# Patient Record
Sex: Female | Born: 1965 | Race: White | Hispanic: No | Marital: Married | Smoking: Former smoker
Health system: Southern US, Community
[De-identification: ages and names within clinical notes are randomized; demographics above are authoritative.]

## PROBLEM LIST (undated history)

## (undated) DIAGNOSIS — F419 Anxiety disorder, unspecified: Secondary | ICD-10-CM

## (undated) DIAGNOSIS — I480 Paroxysmal atrial fibrillation: Secondary | ICD-10-CM

## (undated) HISTORY — DX: Anxiety disorder, unspecified: F41.9

## (undated) HISTORY — DX: Paroxysmal atrial fibrillation: I48.0

## (undated) HISTORY — PX: HAND SURGERY: SHX662

## (undated) HISTORY — PX: TUBAL LIGATION: SHX77

## (undated) HISTORY — PX: BREAST EXCISIONAL BIOPSY: SUR124

---

## 1997-08-24 ENCOUNTER — Emergency Department (HOSPITAL_COMMUNITY): Admission: EM | Admit: 1997-08-24 | Discharge: 1997-08-24 | Payer: Self-pay | Admitting: Emergency Medicine

## 1998-06-28 ENCOUNTER — Emergency Department (HOSPITAL_COMMUNITY): Admission: EM | Admit: 1998-06-28 | Discharge: 1998-06-28 | Payer: Self-pay | Admitting: Emergency Medicine

## 2000-10-31 ENCOUNTER — Encounter: Admission: RE | Admit: 2000-10-31 | Discharge: 2000-10-31 | Payer: Self-pay | Admitting: Family Medicine

## 2000-10-31 ENCOUNTER — Other Ambulatory Visit: Admission: RE | Admit: 2000-10-31 | Discharge: 2000-10-31 | Payer: Self-pay | Admitting: Family Medicine

## 2001-07-21 ENCOUNTER — Encounter (INDEPENDENT_AMBULATORY_CARE_PROVIDER_SITE_OTHER): Payer: Self-pay | Admitting: *Deleted

## 2001-08-01 ENCOUNTER — Encounter: Admission: RE | Admit: 2001-08-01 | Discharge: 2001-08-01 | Payer: Self-pay | Admitting: Family Medicine

## 2003-12-31 ENCOUNTER — Inpatient Hospital Stay (HOSPITAL_COMMUNITY): Admission: AD | Admit: 2003-12-31 | Discharge: 2003-12-31 | Payer: Self-pay | Admitting: Obstetrics and Gynecology

## 2004-03-01 ENCOUNTER — Emergency Department (HOSPITAL_COMMUNITY): Admission: EM | Admit: 2004-03-01 | Discharge: 2004-03-01 | Payer: Self-pay | Admitting: Family Medicine

## 2006-05-23 HISTORY — PX: BREAST BIOPSY: SHX20

## 2006-07-21 ENCOUNTER — Encounter (INDEPENDENT_AMBULATORY_CARE_PROVIDER_SITE_OTHER): Payer: Self-pay | Admitting: *Deleted

## 2010-09-15 ENCOUNTER — Emergency Department: Payer: Self-pay | Admitting: Emergency Medicine

## 2010-09-22 ENCOUNTER — Emergency Department: Payer: Self-pay | Admitting: Emergency Medicine

## 2010-09-29 ENCOUNTER — Inpatient Hospital Stay (INDEPENDENT_AMBULATORY_CARE_PROVIDER_SITE_OTHER)
Admission: RE | Admit: 2010-09-29 | Discharge: 2010-09-29 | Disposition: A | Discharge status: Home / Self Care | Payer: Self-pay | Source: Ambulatory Visit | Attending: Family Medicine | Admitting: Family Medicine

## 2010-09-29 ENCOUNTER — Ambulatory Visit (INDEPENDENT_AMBULATORY_CARE_PROVIDER_SITE_OTHER): Payer: Self-pay

## 2010-09-29 DIAGNOSIS — R5381 Other malaise: Secondary | ICD-10-CM

## 2010-09-29 LAB — POCT I-STAT, CHEM 8
Creatinine, Ser: 0.8 mg/dL (ref 0.4–1.2)
HCT: 42 % (ref 36.0–46.0)
TCO2: 29 mmol/L (ref 0–100)

## 2010-09-29 LAB — TSH: TSH: 3.202 u[IU]/mL (ref 0.350–4.500)

## 2011-12-13 ENCOUNTER — Ambulatory Visit: Payer: Self-pay

## 2011-12-16 ENCOUNTER — Ambulatory Visit: Payer: Self-pay

## 2013-01-02 ENCOUNTER — Ambulatory Visit: Payer: Self-pay

## 2013-02-26 ENCOUNTER — Emergency Department: Payer: Self-pay | Admitting: Emergency Medicine

## 2013-02-26 LAB — TSH: Thyroid Stimulating Horm: 3.98 u[IU]/mL

## 2013-02-26 LAB — URINALYSIS, COMPLETE
Blood: NEGATIVE
Glucose,UR: NEGATIVE mg/dL (ref 0–75)
Ketone: NEGATIVE
Leukocyte Esterase: NEGATIVE
Protein: NEGATIVE
Specific Gravity: 1.012 (ref 1.003–1.030)
Squamous Epithelial: <1

## 2013-02-26 LAB — COMPREHENSIVE METABOLIC PANEL
Albumin: 4.2 g/dL (ref 3.4–5.0)
Alkaline Phosphatase: 47 U/L — ABNORMAL LOW (ref 50–136)
Bilirubin,Total: 0.5 mg/dL (ref 0.2–1.0)
Calcium, Total: 9.1 mg/dL (ref 8.5–10.1)
Chloride: 104 mmol/L (ref 98–107)
EGFR (Non-African Amer.): >60
Glucose: 81 mg/dL (ref 65–99)
Potassium: 3.9 mmol/L (ref 3.5–5.1)
SGPT (ALT): 22 U/L (ref 12–78)
Total Protein: 7.7 g/dL (ref 6.4–8.2)

## 2013-02-26 LAB — CBC WITH DIFFERENTIAL/PLATELET
Basophil %: 0.8 %
HCT: 38.8 % (ref 35.0–47.0)
HGB: 13.4 g/dL (ref 12.0–16.0)
Lymphocyte #: 1.5 10*3/uL (ref 1.0–3.6)
Lymphocyte %: 30.4 %
MCHC: 34.4 g/dL (ref 32.0–36.0)
MCV: 87 fL (ref 80–100)
Monocyte #: 0.3 x10 3/mm (ref 0.2–0.9)
Neutrophil #: 3 10*3/uL (ref 1.4–6.5)
RBC: 4.44 10*6/uL (ref 3.80–5.20)
RDW: 14.5 % (ref 11.5–14.5)

## 2013-05-08 ENCOUNTER — Emergency Department: Payer: Self-pay | Admitting: Emergency Medicine

## 2013-05-08 LAB — COMPREHENSIVE METABOLIC PANEL
BUN: 15 mg/dL (ref 7–18)
Creatinine: 0.7 mg/dL (ref 0.60–1.30)
Glucose: 106 mg/dL — ABNORMAL HIGH (ref 65–99)
Osmolality: 281 (ref 275–301)
SGOT(AST): 24 U/L (ref 15–37)
SGPT (ALT): 24 U/L (ref 12–78)
Sodium: 140 mmol/L (ref 136–145)
Total Protein: 7.5 g/dL (ref 6.4–8.2)

## 2013-05-08 LAB — CBC
MCH: 29.4 pg (ref 26.0–34.0)
MCHC: 33.4 g/dL (ref 32.0–36.0)
MCV: 88 fL (ref 80–100)
Platelet: 215 10*3/uL (ref 150–440)
RDW: 14.4 % (ref 11.5–14.5)

## 2013-05-08 LAB — URINALYSIS, COMPLETE
Bilirubin,UR: NEGATIVE
Glucose,UR: NEGATIVE mg/dL (ref 0–75)
Ketone: NEGATIVE
Nitrite: NEGATIVE
Specific Gravity: 1.014 (ref 1.003–1.030)

## 2014-06-09 ENCOUNTER — Ambulatory Visit: Payer: Self-pay | Admitting: Internal Medicine

## 2015-08-12 ENCOUNTER — Encounter: Payer: Self-pay | Admitting: Medical Oncology

## 2015-08-12 ENCOUNTER — Emergency Department: Payer: 59

## 2015-08-12 ENCOUNTER — Emergency Department
Admission: EM | Admit: 2015-08-12 | Discharge: 2015-08-12 | Disposition: A | Discharge status: Home / Self Care | Payer: 59 | Attending: Emergency Medicine | Admitting: Emergency Medicine

## 2015-08-12 DIAGNOSIS — F17200 Nicotine dependence, unspecified, uncomplicated: Secondary | ICD-10-CM | POA: Diagnosis not present

## 2015-08-12 DIAGNOSIS — R1011 Right upper quadrant pain: Secondary | ICD-10-CM

## 2015-08-12 DIAGNOSIS — Z9851 Tubal ligation status: Secondary | ICD-10-CM | POA: Insufficient documentation

## 2015-08-12 DIAGNOSIS — K2970 Gastritis, unspecified, without bleeding: Secondary | ICD-10-CM | POA: Diagnosis not present

## 2015-08-12 DIAGNOSIS — R1010 Upper abdominal pain, unspecified: Secondary | ICD-10-CM

## 2015-08-12 DIAGNOSIS — K859 Acute pancreatitis without necrosis or infection, unspecified: Secondary | ICD-10-CM | POA: Insufficient documentation

## 2015-08-12 LAB — COMPREHENSIVE METABOLIC PANEL
ALBUMIN: 3.9 g/dL (ref 3.5–5.0)
ALK PHOS: 43 U/L (ref 38–126)
ALT: 22 U/L (ref 14–54)
ANION GAP: 7 (ref 5–15)
AST: 28 U/L (ref 15–41)
BUN: 15 mg/dL (ref 6–20)
CALCIUM: 8.6 mg/dL — AB (ref 8.9–10.3)
CHLORIDE: 101 mmol/L (ref 101–111)
CO2: 27 mmol/L (ref 22–32)
Creatinine, Ser: 0.79 mg/dL (ref 0.44–1.00)
GFR calc Af Amer: >60 mL/min (ref >60)
GFR calc non Af Amer: >60 mL/min (ref >60)
GLUCOSE: 101 mg/dL — AB (ref 65–99)
Potassium: 3.9 mmol/L (ref 3.5–5.1)
SODIUM: 135 mmol/L (ref 135–145)
Total Bilirubin: 0.6 mg/dL (ref 0.3–1.2)
Total Protein: 7 g/dL (ref 6.5–8.1)

## 2015-08-12 LAB — URINALYSIS COMPLETE WITH MICROSCOPIC (ARMC ONLY)
BACTERIA UA: NONE SEEN
Bilirubin Urine: NEGATIVE
Glucose, UA: NEGATIVE mg/dL
Leukocytes, UA: NEGATIVE
Nitrite: NEGATIVE
PH: 6 (ref 5.0–8.0)
PROTEIN: NEGATIVE mg/dL
Specific Gravity, Urine: 1.014 (ref 1.005–1.030)

## 2015-08-12 LAB — LIPASE, BLOOD: Lipase: 55 U/L — ABNORMAL HIGH (ref 11–51)

## 2015-08-12 LAB — CBC
HCT: 44 % (ref 35.0–47.0)
HEMOGLOBIN: 15.1 g/dL (ref 12.0–16.0)
MCH: 30.5 pg (ref 26.0–34.0)
MCHC: 34.2 g/dL (ref 32.0–36.0)
MCV: 88.9 fL (ref 80.0–100.0)
Platelets: 189 10*3/uL (ref 150–440)
RBC: 4.95 MIL/uL (ref 3.80–5.20)
RDW: 14 % (ref 11.5–14.5)
WBC: 4.8 10*3/uL (ref 3.6–11.0)

## 2015-08-12 LAB — POCT PREGNANCY, URINE: PREG TEST UR: NEGATIVE

## 2015-08-12 NOTE — ED Notes (Signed)
Pt reports she has been having upper abd pain for a while but the past few days pain has worsened and she has became nauseated. Pt reports she went to [facility] last night and was given a GI cocktail which seemed to have helped for a little while then pain began again.

## 2015-08-12 NOTE — ED Notes (Signed)
Patient transported to Ultrasound 

## 2015-08-12 NOTE — Discharge Instructions (Signed)
Abdominal Pain, Adult °Many things can cause abdominal pain. Usually, abdominal pain is not caused by a disease and will improve without treatment. It can often be observed and treated at home. Your health care provider will do a physical exam and possibly order blood tests and X-rays to help determine the seriousness of your pain. However, in many cases, more time must pass before a clear cause of the pain can be found. Before that point, your health care provider may not know if you need more testing or further treatment. °HOME CARE INSTRUCTIONS °Monitor your abdominal pain for any changes. The following actions may help to alleviate any discomfort you are experiencing: °· Only take over-the-counter or prescription medicines as directed by your health care provider. °· Do not take laxatives unless directed to do so by your health care provider. °· Try a clear liquid diet (broth, tea, or water) as directed by your health care provider. Slowly move to a bland diet as tolerated. °SEEK MEDICAL CARE IF: °· You have unexplained abdominal pain. °· You have abdominal pain associated with nausea or diarrhea. °· You have pain when you urinate or have a bowel movement. °· You experience abdominal pain that wakes you in the night. °· You have abdominal pain that is worsened or improved by eating food. °· You have abdominal pain that is worsened with eating fatty foods. °· You have a fever. °SEEK IMMEDIATE MEDICAL CARE IF: °· Your pain does not go away within 2 hours. °· You keep throwing up (vomiting). °· Your pain is felt only in portions of the abdomen, such as the right side or the left lower portion of the abdomen. °· You pass bloody or black tarry stools. °MAKE SURE YOU: °· Understand these instructions. °· Will watch your condition. °· Will get help right away if you are not doing well or get worse. °  °This information is not intended to replace advice given to you by your health care provider. Make sure you discuss  any questions you have with your health care provider. °  °Document Released: 02/16/2005 Document Revised: 01/28/2015 Document Reviewed: 01/16/2013 °Elsevier Interactive Patient Education ©2016 [employer] ° °Acute Pancreatitis °Acute pancreatitis is a disease in which the pancreas becomes suddenly inflamed. The pancreas is a large gland located behind your stomach. The pancreas produces enzymes that help digest food. The pancreas also releases the hormones glucagon and insulin that help regulate blood sugar. Damage to the pancreas occurs when the digestive enzymes from the pancreas are activated and begin attacking the pancreas before being released into the intestine. Most acute attacks last a couple of days and can cause serious complications. Some people become dehydrated and develop low blood pressure. In severe cases, bleeding into the pancreas can lead to shock and can be life-threatening. The lungs, heart, and kidneys may fail. °CAUSES  °Pancreatitis can happen to anyone. In some cases, the cause is unknown. Most cases are caused by: °· Alcohol abuse. °· Gallstones. °Other less common causes are: °· Certain medicines. °· Exposure to certain chemicals. °· Infection. °· Damage caused by an accident (trauma). °· Abdominal surgery. °SYMPTOMS  °· Pain in the upper abdomen that may radiate to the back. °· Tenderness and swelling of the abdomen. °· Nausea and vomiting. °DIAGNOSIS  °Your caregiver will perform a physical exam. Blood and stool tests may be done to confirm the diagnosis. Imaging tests may also be done, such as X-rays, CT scans, or an ultrasound of the abdomen. °TREATMENT  °  Treatment usually requires a stay in the hospital. Treatment may include:  Pain medicine.  Fluid replacement through an intravenous line (IV).  Placing a tube in the stomach to remove stomach contents and control vomiting.  Not eating for 3 or 4 days. This gives your pancreas a rest, because enzymes are not being produced  that can cause further damage.  Antibiotic medicines if your condition is caused by an infection.  Surgery of the pancreas or gallbladder. HOME CARE INSTRUCTIONS   Follow the diet advised by your caregiver. This may involve avoiding alcohol and decreasing the amount of fat in your diet.  Eat smaller, more frequent meals. This reduces the amount of digestive juices the pancreas produces.  Drink enough fluids to keep your urine clear or pale yellow.  Only take over-the-counter or prescription medicines as directed by your caregiver.  Avoid drinking alcohol if it caused your condition.  Do not smoke.  Get plenty of rest.  Check your blood sugar at home as directed by your caregiver.  Keep all follow-up appointments as directed by your caregiver. SEEK MEDICAL CARE IF:   You do not recover as quickly as expected.  You develop new or worsening symptoms.  You have persistent pain, weakness, or nausea.  You recover and then have another episode of pain. SEEK IMMEDIATE MEDICAL CARE IF:   You are unable to eat or keep fluids down.  Your pain becomes severe.  You have a fever or persistent symptoms for more than 2 to 3 days.  You have a fever and your symptoms suddenly get worse.  Your skin or the white part of your eyes turn yellow (jaundice).  You develop vomiting.  You feel dizzy, or you faint.  Your blood sugar is high (over 300 mg/dL). MAKE SURE YOU:   Understand these instructions.  Will watch your condition.  Will get help right away if you are not doing well or get worse.   This information is not intended to replace advice given to you by your health care provider. Make sure you discuss any questions you have with your health care provider.   Document Released: 05/09/2005 Document Revised: 11/08/2011 Document Reviewed: 08/18/2011 Elsevier Interactive Patient Education 2016 Elsevier Inc.  Gastritis, Adult Gastritis is soreness and puffiness (inflammation)  of the lining of the stomach. If you do not get help, gastritis can cause bleeding and sores (ulcers) in the stomach. HOME CARE   Only take medicine as told by your doctor.  If you were given antibiotic medicines, take them as told. Finish the medicines even if you start to feel better.  Drink enough fluids to keep your pee (urine) clear or pale yellow.  Avoid foods and drinks that make your problems worse. Foods you may want to avoid include:  Caffeine or alcohol.  Chocolate.  Mint.  Garlic and onions.  Spicy foods.  Citrus fruits, including oranges, lemons, or limes.  Food containing tomatoes, including sauce, chili, salsa, and pizza.  Fried and fatty foods.  Eat small meals throughout the day instead of large meals. GET HELP RIGHT AWAY IF:   You have black or dark red poop (stools).  You throw up (vomit) blood. It may look like coffee grounds.  You cannot keep fluids down.  Your belly (abdominal) pain gets worse.  You have a fever.  You do not feel better after 1 week.  You have any other questions or concerns. MAKE SURE YOU:   Understand these instructions.  Will watch your  condition.  Will get help right away if you are not doing well or get worse.   This information is not intended to replace advice given to you by your health care provider. Make sure you discuss any questions you have with your health care provider.   Document Released: 10/26/2007 Document Revised: 08/01/2011 Document Reviewed: 06/22/2011 Elsevier Interactive Patient Education [employer]

## 2015-08-12 NOTE — ED Provider Notes (Signed)
[facility] Emergency Department Provider Note  ____________________________________________  Time seen: Approximately 845 AM  I have reviewed the triage vital signs and the nursing notes.   HISTORY  Chief Complaint Abdominal Pain and Nausea    HPI Andrea Woods is a 50 y.o. female with a history of tubal ligation and intermittent reflux is presenting with epigastric abdominal pain increasing over the past several months. He says it is worse with eating. She was seen at the [facility] clinic yesterday and was given a GI cocktail which she says completely relieved her pain for some period of time. However, she says that throughout the night she was "tossing and turning" because of discomfort. She denies any vaginal bleeding or discharge. She says there is no radiation of the pain. I do tubal ligation, she has no history of abdominal surgeries.   History reviewed. No pertinent past medical history.  There are no active problems to display for this patient.   Past Surgical History  Procedure Laterality Date  . Tubal ligation      No current outpatient prescriptions on file.  Allergies Review of patient's allergies indicates no known allergies.  No family history on file.  Social History Social History  Substance Use Topics  . Smoking status: Current Some Day Smoker  . Smokeless tobacco: None  . Alcohol Use: No    Review of Systems Constitutional: No fever/chills Eyes: No visual changes. ENT: No sore throat. Cardiovascular: Denies chest pain. Respiratory: Denies shortness of breath. Gastrointestinal: No diarrhea.  No constipation. Genitourinary: Negative for dysuria. Musculoskeletal: Negative for back pain. Skin: Negative for rash. Neurological: Negative for headaches, focal weakness or numbness.  10-point ROS otherwise negative.  ____________________________________________   PHYSICAL EXAM:  VITAL SIGNS: ED Triage Vitals  Enc  Vitals Group     BP 08/12/15 0813 130/101 mmHg     Pulse Rate 08/12/15 0813 83     Resp 08/12/15 0813 18     Temp 08/12/15 0810 97.9 F (36.6 C)     Temp Source 08/12/15 0810 Oral     SpO2 08/12/15 0813 98 %     Weight 08/12/15 0810 135 lb (61.236 kg)     Height 08/12/15 0810  (1.626 m)     Head Cir --      Peak Flow --      Pain Score 08/12/15 0811 3     Pain Loc --      Pain Edu? --      Excl. in GC? --     Constitutional: Alert and oriented. Well appearing and in no acute distress. Eyes: Conjunctivae are normal. PERRL. EOMI. Head: Atraumatic. Nose: No congestion/rhinnorhea. Mouth/Throat: Mucous membranes are moist.  Oropharynx non-erythematous. Neck: No stridor.   Cardiovascular: Normal rate, regular rhythm. Grossly normal heart sounds.  Good peripheral circulation. Respiratory: Normal respiratory effort.  No retractions. Lungs CTAB. Gastrointestinal: Soft with mild epigastric, right and left upper quadrant tenderness palpation but more to the left upper quadrant. There is a negative Murphy sign. No lower abdominal tenderness. No rebound or guarding. No distention. No CVA tenderness. Musculoskeletal: No lower extremity tenderness nor edema.  No joint effusions. Neurologic:  Normal speech and language. No gross focal neurologic deficits are appreciated. No gait instability. Skin:  Skin is warm, dry and intact. No rash noted. Psychiatric: Mood and affect are normal. Speech and behavior are normal.  ____________________________________________   LABS (all labs ordered are listed, but only abnormal results are displayed)  Labs Reviewed  LIPASE, BLOOD - Abnormal; Notable for the following:    Lipase 55 (*)    All other components within normal limits  COMPREHENSIVE METABOLIC PANEL - Abnormal; Notable for the following:    Glucose, Bld 101 (*)    Calcium 8.6 (*)    All other components within normal limits  URINALYSIS COMPLETEWITH MICROSCOPIC ([facility] ONLY) - Abnormal;  Notable for the following:    Color, Urine YELLOW (*)    APPearance CLEAR (*)    Ketones, ur TRACE (*)    Hgb urine dipstick 2+ (*)    Squamous Epithelial / LPF 0-5 (*)    All other components within normal limits  CBC  POC URINE PREG, ED  POCT PREGNANCY, URINE   ____________________________________________  EKG  ED ECG REPORT I, Quamere Mussell,  Teena Iraniavid M, the attending physician, personally viewed and interpreted this ECG.   Date: 08/12/2015  EKG Time: 812  Rate: 84  Rhythm: normal sinus rhythm  Axis: Normal  Intervals:none  ST&T Change: No ST segment elevation or depression. No abnormal T-wave inversion.  ____________________________________________  RADIOLOGY  No acute finding on the ultrasound of the gallbladder. ____________________________________________   PROCEDURES   ____________________________________________   INITIAL IMPRESSION / ASSESSMENT AND PLAN / ED COURSE  Pertinent labs & imaging results that were available during my care of the patient were reviewed by me and considered in my medical decision making (see chart for details).  ----------------------------------------- 11:31 AM on 08/12/2015 -----------------------------------------  Patient resting comfortably at this time. We discussed her labs as well as imaging results. The patient is a very mild elevation in her lipase. She reports no drinking at this time. No history of heavy drinking. Patient says she has normal cholesterol but is unsure her triglycerides. No vomiting in the emergency department. Patient taking omeprazole already at home and I discussed with her that she should add Maalox for breakthrough pain as this was a great help to her at the [facility] yesterday. She will also be following up with her primary care doctor for further testing. We also discussed dietary modifications to make sure that she is not eating any fatty foods and a further irritate her stomach. Possible ulcer to  the stomach or duodenum. We'll need further follow-up with her primary care doctor and possible scope in the future. Abdomen was never rigid and the patient does not present as a perfect viscus. ____________________________________________   FINAL CLINICAL IMPRESSION(S) / ED DIAGNOSES  Final diagnoses:  RUQ abdominal pain  Gastritis, epigastric abdominal pain.    Myrna Blazeravid Matthew Kendarius Vigen, MD 08/12/15 [phone]

## 2016-02-01 ENCOUNTER — Other Ambulatory Visit: Payer: Self-pay | Admitting: Family Medicine

## 2016-02-01 DIAGNOSIS — Z1231 Encounter for screening mammogram for malignant neoplasm of breast: Secondary | ICD-10-CM

## 2016-02-08 ENCOUNTER — Other Ambulatory Visit: Payer: Self-pay | Admitting: Family Medicine

## 2016-02-08 ENCOUNTER — Ambulatory Visit
Admission: RE | Admit: 2016-02-08 | Discharge: 2016-02-08 | Disposition: A | Discharge status: Home / Self Care | Payer: 59 | Source: Ambulatory Visit | Attending: Family Medicine | Admitting: Family Medicine

## 2016-02-08 DIAGNOSIS — Z1231 Encounter for screening mammogram for malignant neoplasm of breast: Secondary | ICD-10-CM | POA: Insufficient documentation

## 2017-01-19 ENCOUNTER — Other Ambulatory Visit: Payer: Self-pay | Admitting: Family Medicine

## 2017-01-19 DIAGNOSIS — Z1231 Encounter for screening mammogram for malignant neoplasm of breast: Secondary | ICD-10-CM

## 2017-02-13 ENCOUNTER — Ambulatory Visit
Admission: RE | Admit: 2017-02-13 | Discharge: 2017-02-13 | Disposition: A | Discharge status: Home / Self Care | Payer: 59 | Source: Ambulatory Visit | Attending: Family Medicine | Admitting: Family Medicine

## 2017-02-13 DIAGNOSIS — Z1231 Encounter for screening mammogram for malignant neoplasm of breast: Secondary | ICD-10-CM

## 2017-03-19 ENCOUNTER — Encounter: Payer: Self-pay | Admitting: Emergency Medicine

## 2017-03-19 ENCOUNTER — Emergency Department: Payer: 59

## 2017-03-19 ENCOUNTER — Emergency Department
Admission: EM | Admit: 2017-03-19 | Discharge: 2017-03-19 | Disposition: A | Discharge status: Home / Self Care | Payer: 59 | Attending: Emergency Medicine | Admitting: Emergency Medicine

## 2017-03-19 DIAGNOSIS — F17200 Nicotine dependence, unspecified, uncomplicated: Secondary | ICD-10-CM | POA: Insufficient documentation

## 2017-03-19 DIAGNOSIS — M25511 Pain in right shoulder: Secondary | ICD-10-CM | POA: Insufficient documentation

## 2017-03-19 DIAGNOSIS — H5711 Ocular pain, right eye: Secondary | ICD-10-CM | POA: Diagnosis present

## 2017-03-19 DIAGNOSIS — H1089 Other conjunctivitis: Secondary | ICD-10-CM | POA: Insufficient documentation

## 2017-03-19 MED ORDER — TETRACAINE HCL 0.5 % OP SOLN
1.0000 [drp] | Freq: Once | OPHTHALMIC | Status: DC
  Filled 2017-03-19: qty 4

## 2017-03-19 MED ORDER — EYE WASH OPHTH SOLN
1.0000 [drp] | OPHTHALMIC | Status: DC | PRN
  Filled 2017-03-19: qty 118

## 2017-03-19 MED ORDER — NAPROXEN 500 MG PO TABS: 500.0000 mg | ORAL_TABLET | Freq: Two times a day (BID) | ORAL | 0 refills | Status: DC

## 2017-03-19 MED ORDER — TOBRAMYCIN 0.3 % OP SOLN: OPHTHALMIC | 0 refills | Status: DC

## 2017-03-19 MED ORDER — FLUORESCEIN SODIUM 1 MG OP STRP
1.0000 | ORAL_STRIP | Freq: Once | OPHTHALMIC | Status: DC
  Filled 2017-03-19: qty 1

## 2017-03-19 NOTE — ED Notes (Signed)
Patient is complaining of right shoulder pain x 1 week and left eye pain x 1 day.  Patient was poked in the eye, accidentally yesterday and fell 1 week ago on her shoulder.  Patient is in no obvious distress at this time.

## 2017-03-19 NOTE — Discharge Instructions (Signed)
Follow-up with Dr. Sharman CrateBrassington if any continued problems with your  eye. Begin using Tobrex ophthalmic solution 2 drops 4 times a day to the right eye. Also follow-up with your primary care doctor or Dr. Martha ClanKrasinski who is on call for orthopedics if he continued to have problems with your shoulder. Begin taking naproxen 500 mg twice a day with food. You may continue using ice or heat to your shoulder as needed for comfort. Also to passive range of motion to increase your ability to move your shoulder without pain.

## 2017-03-19 NOTE — ED Provider Notes (Signed)
[facility] Emergency Department Provider Note  ____________________________________________   First MD Initiated Contact with Patient 03/19/17 [phone]     (approximate)  I have reviewed the triage vital signs and the nursing notes.   HISTORY  Chief Complaint Eye Problem and Shoulder Pain    HPI Andrea Woods is a 51 y.o. female is here with complaint of eye discomfort. Patient states that she was playing peekaboo with her grandson last night when he poked her in her eye. She does have some discomfort with bright light. She denies any visual changes. She does have a history of problems with her right eye in the past and is anxious. She also is here to have her right shoulder seen as she fell approximately one week ago and continues to have pain. She has taken ibuprofen infrequently for her shoulder pain. She continues to have pain with range of motion.   History reviewed. No pertinent past medical history.  There are no active problems to display for this patient.   Past Surgical History:  Procedure Laterality Date  . BREAST BIOPSY Right 2008   neg  . TUBAL LIGATION      Prior to Admission medications   Medication Sig Start Date End Date Taking? Authorizing Provider  naproxen (NAPROSYN) 500 MG tablet Take 1 tablet (500 mg total) by mouth 2 (two) times daily with a meal. 03/19/17   Tommi RumpsSummers, Rhonda L, PA-C  tobramycin (TOBREX) 0.3 % ophthalmic solution 2 drops to right eye qid 03/19/17   Tommi RumpsSummers, Rhonda L, PA-C    Allergies Patient has no known allergies.  Family History  Problem Relation Age of Onset  . Breast cancer Neg Hx     Social History Social History  Substance Use Topics  . Smoking status: Current Some Day Smoker  . Smokeless tobacco: Not on file  . Alcohol use No    Review of Systems Constitutional: No fever/chills Eyes: right eye discomfort secondary to trauma. Cardiovascular: Denies chest pain. Respiratory: Denies  shortness of breath. Musculoskeletal: positive for right shoulder pain. Neurological: Negative for headaches ___________________________________________   PHYSICAL EXAM:  VITAL SIGNS: ED Triage Vitals  Enc Vitals Group     BP 03/19/17 0910 (!) 151/100     Pulse Rate 03/19/17 0910 66     Resp 03/19/17 0910 16     Temp 03/19/17 0910 97.6 F (36.4 C)     Temp Source 03/19/17 0910 Oral     SpO2 03/19/17 0910 99 %     Weight 03/19/17 0911 135 lb (61.2 kg)     Height 03/19/17 0911 5\' 4"  (1.626 m)     Head Circumference --      Peak Flow --      Pain Score --      Pain Loc --      Pain Edu? --      Excl. in GC? --    Constitutional: Alert and oriented. Well appearing and in no acute distress. Eyes: Conjunctivae are normal left eye. Right eye with mild injection. PERRL. EOMI. Head: Atraumatic. Nose: No congestion/rhinnorhea. Neck: No stridor.   Cardiovascular: Normal rate, regular rhythm. Grossly normal heart sounds.  Good peripheral circulation. Respiratory: Normal respiratory effort.  No retractions. Lungs CTAB. Musculoskeletal: range of motion with the right shoulder is limited in abduction secondary to discomfort. There is some soft tissue swelling noted anteriorly. No crepitus is noted. nontender palpation of clavicle. Neurologic:  Normal speech and language. No gross focal neurologic deficits are  appreciated.  Skin:  Skin is warm, dry and intact.  Psychiatric: Mood and affect are normal. Speech and behavior are normal.  ____________________________________________   LABS (all labs ordered are listed, but only abnormal results are displayed)  Labs Reviewed - No data to display RADIOLOGY  Dg Shoulder Right  Result Date: 03/19/2017 CLINICAL DATA:  Patient reports fall approx. 1 week ago onto right shoulder. Reports limited ROM and diffuse right shoulder pain. Patient unable to abduct arm for axillary image. No previous injury to right shoulder. EXAM: RIGHT SHOULDER - 2+  VIEW COMPARISON:  None. FINDINGS: There is no acute fracture or subluxation. Right lung apex is clear. There is a remote fracture of the right lateral seventh rib. IMPRESSION: No acute abnormality. Electronically Signed   By: Norva Pavlov M.D.   On: 03/19/2017 10:15    ____________________________________________   PROCEDURES  Procedure(s) performed: tetracaine was placed in the right eye. Exam did not show any obvious corneal abrasions. Fluorescein dye was placed without corneal abrasions noted. There is some uptake for the sclera at approximately 6:00 position.lid was inverted and no foreign bodies were noted.  Procedures  Critical Care performed: No  ____________________________________________   INITIAL IMPRESSION / ASSESSMENT AND PLAN / ED COURSE  Patient was discharged with a prescription for Tobrex ophthalmic solution 2 drops 4 times a day to the right eye. She is also to discontinue taking ibuprofen as she is afraid that it will upset her stomach if she takes it frequently. She is given a prescription for naproxen 500 mg twice a day with food. She is encouraged to use ice or heat to her shoulder as needed for comfort.and she is also to engage in passive range of motion for her shoulder to increase mobility. She will follow up Dr. Sharman Crate if any continued problems with her right eye. She is also to follow-up with Dr. Martha Clan or her PCP if any continued problems with her shoulder.   ___________________________________________   FINAL CLINICAL IMPRESSION(S) / ED DIAGNOSES  Final diagnoses:  Acute pain of right shoulder  Conjunctivitis, traumatic      NEW MEDICATIONS STARTED DURING THIS VISIT:  Discharge Medication List as of 03/19/2017 11:11 AM    START taking these medications   Details  naproxen (NAPROSYN) 500 MG tablet Take 1 tablet (500 mg total) by mouth 2 (two) times daily with a meal., Starting Sun 03/19/2017, Print    tobramycin (TOBREX) 0.3 %  ophthalmic solution 2 drops to right eye qid, Print         Note:  This document was prepared using Dragon voice recognition software and may include unintentional dictation errors.    Tommi Rumps, PA-C 03/19/17 1230    Minna Antis, MD 03/19/17 1408

## 2017-03-19 NOTE — ED Notes (Signed)
Patient ambulatory to xray at this time.  Will continue to monitor.

## 2017-03-19 NOTE — ED Triage Notes (Signed)
Pt reports was playing peek a book with her grandson yesterday and he poked her in her right eye. Pt report rt eye red and hurts. Pt also reports fell a week ago and her right shoulder still hurts.

## 2017-03-19 NOTE — ED Notes (Signed)
PA student in room to assess patient at this time.  Will continue to monitor.

## 2018-01-10 ENCOUNTER — Other Ambulatory Visit: Payer: Self-pay | Admitting: Family Medicine

## 2018-01-10 DIAGNOSIS — Z1231 Encounter for screening mammogram for malignant neoplasm of breast: Secondary | ICD-10-CM

## 2018-02-14 ENCOUNTER — Ambulatory Visit
Admission: RE | Admit: 2018-02-14 | Discharge: 2018-02-14 | Disposition: A | Discharge status: Home / Self Care | Payer: 59 | Source: Ambulatory Visit | Attending: Family Medicine | Admitting: Family Medicine

## 2018-02-14 ENCOUNTER — Encounter (INDEPENDENT_AMBULATORY_CARE_PROVIDER_SITE_OTHER): Payer: Self-pay

## 2018-02-14 DIAGNOSIS — Z1231 Encounter for screening mammogram for malignant neoplasm of breast: Secondary | ICD-10-CM | POA: Insufficient documentation

## 2019-01-04 ENCOUNTER — Other Ambulatory Visit: Payer: Self-pay | Admitting: Family Medicine

## 2019-01-04 DIAGNOSIS — Z1231 Encounter for screening mammogram for malignant neoplasm of breast: Secondary | ICD-10-CM

## 2019-02-18 ENCOUNTER — Inpatient Hospital Stay: Admission: RE | Admit: 2019-02-18 | Payer: 59 | Source: Ambulatory Visit

## 2019-05-20 ENCOUNTER — Ambulatory Visit
Admission: RE | Admit: 2019-05-20 | Discharge: 2019-05-20 | Disposition: A | Discharge status: Home / Self Care | Payer: 59 | Source: Ambulatory Visit | Attending: Family Medicine | Admitting: Family Medicine

## 2019-05-20 ENCOUNTER — Other Ambulatory Visit: Payer: Self-pay

## 2019-05-20 DIAGNOSIS — Z1231 Encounter for screening mammogram for malignant neoplasm of breast: Secondary | ICD-10-CM | POA: Diagnosis not present

## 2019-05-28 ENCOUNTER — Ambulatory Visit: Payer: 59

## 2019-11-14 ENCOUNTER — Other Ambulatory Visit: Payer: Self-pay | Admitting: Unknown Physician Specialty

## 2019-11-14 DIAGNOSIS — G8929 Other chronic pain: Secondary | ICD-10-CM

## 2019-11-30 ENCOUNTER — Ambulatory Visit: Payer: 59

## 2019-12-05 ENCOUNTER — Other Ambulatory Visit: Payer: Self-pay

## 2019-12-05 ENCOUNTER — Ambulatory Visit
Admission: RE | Admit: 2019-12-05 | Discharge: 2019-12-05 | Disposition: A | Discharge status: Home / Self Care | Payer: BC Managed Care – PPO | Source: Ambulatory Visit | Attending: Unknown Physician Specialty | Admitting: Unknown Physician Specialty

## 2019-12-05 DIAGNOSIS — R519 Headache, unspecified: Secondary | ICD-10-CM | POA: Diagnosis not present

## 2019-12-05 DIAGNOSIS — G8929 Other chronic pain: Secondary | ICD-10-CM | POA: Diagnosis present

## 2019-12-05 MED ORDER — GADOBUTROL 1 MMOL/ML IV SOLN
6.0000 mL | Freq: Once | INTRAVENOUS | Status: AC | PRN
  Administered 2019-12-05: 6 mL via INTRAVENOUS

## 2019-12-13 ENCOUNTER — Other Ambulatory Visit: Payer: Self-pay | Admitting: Family Medicine

## 2019-12-13 DIAGNOSIS — R2241 Localized swelling, mass and lump, right lower limb: Secondary | ICD-10-CM

## 2020-06-04 ENCOUNTER — Emergency Department: Payer: Managed Care, Other (non HMO)

## 2020-06-04 ENCOUNTER — Encounter: Payer: Self-pay | Admitting: Emergency Medicine

## 2020-06-04 ENCOUNTER — Emergency Department
Admission: EM | Admit: 2020-06-04 | Discharge: 2020-06-04 | Disposition: A | Discharge status: Home / Self Care | Payer: Managed Care, Other (non HMO) | Attending: Emergency Medicine | Admitting: Emergency Medicine

## 2020-06-04 ENCOUNTER — Other Ambulatory Visit: Payer: Self-pay

## 2020-06-04 DIAGNOSIS — R030 Elevated blood-pressure reading, without diagnosis of hypertension: Secondary | ICD-10-CM | POA: Diagnosis not present

## 2020-06-04 DIAGNOSIS — R109 Unspecified abdominal pain: Secondary | ICD-10-CM

## 2020-06-04 DIAGNOSIS — R1011 Right upper quadrant pain: Secondary | ICD-10-CM | POA: Diagnosis not present

## 2020-06-04 DIAGNOSIS — F17200 Nicotine dependence, unspecified, uncomplicated: Secondary | ICD-10-CM | POA: Insufficient documentation

## 2020-06-04 DIAGNOSIS — R10A0 Flank pain, unspecified side: Secondary | ICD-10-CM

## 2020-06-04 LAB — COMPREHENSIVE METABOLIC PANEL
ALT: 23 U/L (ref 0–44)
AST: 17 U/L (ref 15–41)
Albumin: 4.4 g/dL (ref 3.5–5.0)
Alkaline Phosphatase: 36 U/L — ABNORMAL LOW (ref 38–126)
Anion gap: 9 (ref 5–15)
BUN: 25 mg/dL — ABNORMAL HIGH (ref 6–20)
CO2: 27 mmol/L (ref 22–32)
Calcium: 9.3 mg/dL (ref 8.9–10.3)
Chloride: 101 mmol/L (ref 98–111)
Creatinine, Ser: 0.84 mg/dL (ref 0.44–1.00)
GFR, Estimated: >60 mL/min (ref >60)
Glucose, Bld: 91 mg/dL (ref 70–99)
Potassium: 4 mmol/L (ref 3.5–5.1)
Sodium: 137 mmol/L (ref 135–145)
Total Bilirubin: 0.8 mg/dL (ref 0.3–1.2)
Total Protein: 7.3 g/dL (ref 6.5–8.1)

## 2020-06-04 LAB — CBC
HCT: 40.7 % (ref 36.0–46.0)
Hemoglobin: 14 g/dL (ref 12.0–15.0)
MCH: 29.9 pg (ref 26.0–34.0)
MCHC: 34.4 g/dL (ref 30.0–36.0)
MCV: 86.8 fL (ref 80.0–100.0)
Platelets: 265 10*3/uL (ref 150–400)
RBC: 4.69 MIL/uL (ref 3.87–5.11)
RDW: 14.6 % (ref 11.5–15.5)
WBC: 7.5 10*3/uL (ref 4.0–10.5)
nRBC: 0 % (ref 0.0–0.2)

## 2020-06-04 LAB — URINALYSIS, COMPLETE (UACMP) WITH MICROSCOPIC
Bacteria, UA: NONE SEEN
Bilirubin Urine: NEGATIVE
Glucose, UA: NEGATIVE mg/dL
Hgb urine dipstick: NEGATIVE
Ketones, ur: NEGATIVE mg/dL
Leukocytes,Ua: NEGATIVE
Nitrite: NEGATIVE
Protein, ur: NEGATIVE mg/dL
Specific Gravity, Urine: 1.026 (ref 1.005–1.030)
pH: 5 (ref 5.0–8.0)

## 2020-06-04 LAB — LIPASE, BLOOD: Lipase: 48 U/L (ref 11–51)

## 2020-06-04 MED ORDER — LIDOCAINE 5 % EX PTCH: 1.0000 | MEDICATED_PATCH | Freq: Two times a day (BID) | CUTANEOUS | 0 refills | Status: AC

## 2020-06-04 MED ORDER — KETOROLAC TROMETHAMINE 30 MG/ML IJ SOLN
15.0000 mg | Freq: Once | INTRAMUSCULAR | Status: AC
  Administered 2020-06-04: 15 mg via INTRAVENOUS
  Filled 2020-06-04: qty 1

## 2020-06-04 MED ORDER — LACTATED RINGERS IV BOLUS
1000.0000 mL | Freq: Once | INTRAVENOUS | Status: AC
  Administered 2020-06-04: 1000 mL via INTRAVENOUS

## 2020-06-04 MED ORDER — IOHEXOL 300 MG/ML  SOLN
100.0000 mL | Freq: Once | INTRAMUSCULAR | Status: AC | PRN
  Administered 2020-06-04: 100 mL via INTRAVENOUS

## 2020-06-04 NOTE — ED Notes (Signed)
US in room 

## 2020-06-04 NOTE — ED Notes (Signed)
Patient transported to CT 

## 2020-06-04 NOTE — ED Notes (Signed)
Pt states pain has improved, but she still has some pain. EDP notified.

## 2020-06-04 NOTE — ED Notes (Signed)
Pt wheeled to lobby  

## 2020-06-04 NOTE — ED Provider Notes (Signed)
[facility] Emergency Department Provider Note   ____________________________________________   Event Date/Time   First MD Initiated Contact with Patient 06/04/20 1037     (approximate)  I have reviewed the triage vital signs and the nursing notes.   HISTORY  Chief Complaint Abdominal Pain    HPI Andrea Woods is a 55 y.o. female with no significant past medical history who presents to the ED complaining of abdominal pain.  Patient reports that while she was getting ready for work this morning she had acute onset of pain in the right upper quadrant of her abdomen.  She describes the pain as sharp and constant, not exacerbated or alleviated by anything.  She denies any associated nausea or vomiting and onset of pain was not associated with eating as patient has not had anything to eat yet today.  She denies any fevers, cough, chest pain, shortness of breath, dysuria, hematuria, or changes in bowel movements.  She has never had similar pain in the past and denies any significant abdominal surgical history.  She is postmenopausal.        History reviewed. No pertinent past medical history.  There are no problems to display for this patient.   Past Surgical History:  Procedure Laterality Date  . BREAST BIOPSY Right 2008   neg  . TUBAL LIGATION      Prior to Admission medications   Medication Sig Start Date End Date Taking? Authorizing Provider  lidocaine (LIDODERM) 5 % Place 1 patch onto the skin every 12 (twelve) hours for 5 days. Remove & Discard patch within 12 hours or as directed by MD 06/04/20 06/09/20 Yes Chesley Noon, MD  naproxen (NAPROSYN) 500 MG tablet Take 1 tablet (500 mg total) by mouth 2 (two) times daily with a meal. 03/19/17   Tommi Rumps, PA-C  tobramycin (TOBREX) 0.3 % ophthalmic solution 2 drops to right eye qid 03/19/17   Tommi Rumps, PA-C    Allergies Patient has no known allergies.  Family History  Problem  Relation Age of Onset  . Breast cancer Neg Hx     Social History Social History   Tobacco Use  . Smoking status: Current Some Day Smoker  . Smokeless tobacco: Never Used  Vaping Use  . Vaping Use: Never used  Substance Use Topics  . Alcohol use: No    Review of Systems  Constitutional: No fever/chills Eyes: No visual changes. ENT: No sore throat. Cardiovascular: Denies chest pain. Respiratory: Denies shortness of breath. Gastrointestinal: Positive for abdominal pain.  No nausea, no vomiting.  No diarrhea.  No constipation. Genitourinary: Negative for dysuria. Musculoskeletal: Negative for back pain. Skin: Negative for rash. Neurological: Negative for headaches, focal weakness or numbness.  ____________________________________________   PHYSICAL EXAM:  VITAL SIGNS: ED Triage Vitals  Enc Vitals Group     BP 06/04/20 0849 (!) 173/104     Pulse Rate 06/04/20 0849 68     Resp 06/04/20 0849 16     Temp 06/04/20 0849 98 F (36.7 C)     Temp Source 06/04/20 0849 Oral     SpO2 06/04/20 0849 97 %     Weight 06/04/20 0843 145 lb (65.8 kg)     Height 06/04/20 0843 5\' 4"  (1.626 m)     Head Circumference --      Peak Flow --      Pain Score 06/04/20 0843 9     Pain Loc --      Pain Edu? --  Excl. in GC? --     Constitutional: Alert and oriented. Eyes: Conjunctivae are normal. Head: Atraumatic. Nose: No congestion/rhinnorhea. Mouth/Throat: Mucous membranes are moist. Neck: Normal ROM Cardiovascular: Normal rate, regular rhythm. Grossly normal heart sounds. Respiratory: Normal respiratory effort.  No retractions. Lungs CTAB. Gastrointestinal: Soft and tender to palpation in the right upper quadrant with no rebound or guarding. No distention. Genitourinary: deferred Musculoskeletal: No lower extremity tenderness nor edema. Neurologic:  Normal speech and language. No gross focal neurologic deficits are appreciated. Skin:  Skin is warm, dry and intact. No rash  noted. Psychiatric: Mood and affect are normal. Speech and behavior are normal.  ____________________________________________   LABS (all labs ordered are listed, but only abnormal results are displayed)  Labs Reviewed  COMPREHENSIVE METABOLIC PANEL - Abnormal; Notable for the following components:      Result Value   BUN 25 (*)    Alkaline Phosphatase 36 (*)    All other components within normal limits  URINALYSIS, COMPLETE (UACMP) WITH MICROSCOPIC - Abnormal; Notable for the following components:   Color, Urine YELLOW (*)    APPearance HAZY (*)    All other components within normal limits  LIPASE, BLOOD  CBC   ____________________________________________  EKG  ED ECG REPORT I, Chesley Noon, the attending physician, personally viewed and interpreted this ECG.   Date: 06/04/2020  EKG Time: 8:50  Rate: 61  Rhythm: normal sinus rhythm  Axis: Normal  Intervals:none  ST&T Change: None   PROCEDURES  Procedure(s) performed (including Critical Care):  Procedures   ____________________________________________   INITIAL IMPRESSION / ASSESSMENT AND PLAN / ED COURSE       55 year old female with no significant past medical history presents to the ED complaining of acute onset right upper quadrant abdominal pain starting this morning while getting ready for work.  Pain is reproduced with palpation of her right upper quadrant and we will further assess with ultrasound.  Labs thus far are unremarkable, LFTs and lipase within normal limits.  EKG shows no evidence of arrhythmia or ischemia and I doubt primary cardiac process, patient denies any respiratory symptoms to suggest pneumonia or PE.  Patient feeling better following dose of Toradol, right upper quadrant ultrasound is unremarkable.  CT scan of abdomen/pelvis was also performed and negative for acute process.  Patient does state that she was at work moving heavy boxes yesterday and most likely explanation for her pain  appears to be a muscle strain given reassuring work-up.  Will prescribe Lidoderm patch, patient counseled to use over-the-counter anti-inflammatories along with ice and rest.  She was counseled to follow-up with her PCP and otherwise return to the ED for new or worsening symptoms.  Patient agrees with plan.      ____________________________________________   FINAL CLINICAL IMPRESSION(S) / ED DIAGNOSES  Final diagnoses:  RUQ pain  Flank pain     ED Discharge Orders         Ordered    lidocaine (LIDODERM) 5 %  Every 12 hours        06/04/20 1252           Note:  This document was prepared using Dragon voice recognition software and may include unintentional dictation errors.   Chesley Noon, MD 06/04/20 1255

## 2020-06-04 NOTE — ED Triage Notes (Signed)
Pt comes into the ED via [facility] clinic c/o RUQ abdominal pain with acute onset.  Pt denies any N/V but per [facility] clinic, the patient came off the bed upon palpation of the stomach.  Pt denies any other problems than the stomach pain and currently has even and unlabored respirations.  Pt states the pain radiates into her back as well.

## 2020-06-26 ENCOUNTER — Other Ambulatory Visit: Payer: Self-pay | Admitting: Family Medicine

## 2020-06-26 DIAGNOSIS — Z1231 Encounter for screening mammogram for malignant neoplasm of breast: Secondary | ICD-10-CM

## 2020-07-20 ENCOUNTER — Ambulatory Visit
Admission: RE | Admit: 2020-07-20 | Discharge: 2020-07-20 | Disposition: A | Discharge status: Home / Self Care | Payer: Managed Care, Other (non HMO) | Source: Ambulatory Visit | Attending: Family Medicine | Admitting: Family Medicine

## 2020-07-20 ENCOUNTER — Other Ambulatory Visit: Payer: Self-pay

## 2020-07-20 DIAGNOSIS — Z1231 Encounter for screening mammogram for malignant neoplasm of breast: Secondary | ICD-10-CM | POA: Insufficient documentation

## 2020-09-18 ENCOUNTER — Ambulatory Visit: Payer: Managed Care, Other (non HMO) | Admitting: Internal Medicine

## 2020-09-23 ENCOUNTER — Ambulatory Visit: Payer: Managed Care, Other (non HMO) | Admitting: Internal Medicine

## 2020-09-30 ENCOUNTER — Other Ambulatory Visit: Payer: Self-pay

## 2020-09-30 ENCOUNTER — Ambulatory Visit (INDEPENDENT_AMBULATORY_CARE_PROVIDER_SITE_OTHER): Payer: Managed Care, Other (non HMO)

## 2020-09-30 ENCOUNTER — Encounter: Payer: Self-pay | Admitting: Internal Medicine

## 2020-09-30 ENCOUNTER — Ambulatory Visit: Payer: Managed Care, Other (non HMO) | Admitting: Internal Medicine

## 2020-09-30 VITALS — BP 150/104 | HR 67 | Ht 64.0 in | Wt 148.0 lb

## 2020-09-30 DIAGNOSIS — I480 Paroxysmal atrial fibrillation: Secondary | ICD-10-CM

## 2020-09-30 DIAGNOSIS — I10 Essential (primary) hypertension: Secondary | ICD-10-CM

## 2020-09-30 DIAGNOSIS — R002 Palpitations: Secondary | ICD-10-CM

## 2020-09-30 NOTE — Patient Instructions (Signed)
Medication Instructions:  - Your physician recommends that you continue on your current medications as directed. Please refer to the Current Medication list given to you today.  *If you need a refill on your cardiac medications before your next appointment, please call your pharmacy*   Lab Work: - none ordered  If you have labs (blood work) drawn today and your tests are completely normal, you will receive your results only by: Marland Kitchen MyChart Message (if you have MyChart) OR . A paper copy in the mail If you have any lab test that is abnormal or we need to change your treatment, we will call you to review the results.   Testing/Procedures: - Your physician has recommended that you wear a Zio XT (heart) monitor x 14 days.   This will be placed in clinic today.  This monitor is a medical device that records the heart's electrical activity. Doctors most often use these monitors to diagnose arrhythmias. Arrhythmias are problems with the speed or rhythm of the heartbeat. The monitor is a small device applied to your chest. You can wear one while you do your normal daily activities. While wearing this monitor if you have any symptoms to push the button and record what you felt. Once you have worn this monitor for the period of time provider prescribed (Usually 14 days), you will return the monitor device in the postage paid box. Once it is returned they will download the data collected and provide [place] with a report which the provider will then review and we will call you with those results. Important tips:  1. Avoid showering during the first 24 hours of wearing the monitor. 2. Avoid excessive sweating to help maximize wear time. 3. Do not submerge the device, no hot tubs, and no swimming pools. 4. Keep any lotions or oils away from the patch. 5. After 24 hours you may shower with the patch on. Take brief showers with your back facing the shower head.  6. Do not remove patch once it has been placed  because that will interrupt data and decrease adhesive wear time. 7. Push the button when you have any symptoms and write down what you were feeling. 8. Once you have completed wearing your monitor, remove and place into box which has postage paid and place in your outgoing mailbox.  9. If for some reason you have misplaced your box then call our office and we can provide another box and/or mail it off for you.   Follow-Up: At [facility], you and your health needs are our priority.  As part of our continuing mission to provide you with exceptional heart care, we have created designated Provider Care Teams.  These Care Teams include your primary Cardiologist (physician) and Advanced Practice Providers (APPs -  Physician Assistants and Nurse Practitioners) who all work together to provide you with the care you need, when you need it.  We recommend signing up for the patient portal called "MyChart".  Sign up information is provided on this After Visit Summary.  MyChart is used to connect with patients for Virtual Visits (Telemedicine).  Patients are able to view lab/test results, encounter notes, upcoming appointments, etc.  Non-urgent messages can be sent to your provider as well.   To learn more about what you can do with MyChart, go to [email or web address].    Your next appointment:   6 week(s)  The format for your next appointment:   In Person  Provider:   You may see Cristal Deer  End, MD or one of the following Advanced Practice Providers on your designated Care Team:    Nicolasa Ducking, NP  Eula Listen, PA-C  Marisue Ivan, PA-C  Cadence Cleveland, [place]  Gillian Shields, NP    Other Instructions n/a

## 2020-09-30 NOTE — Progress Notes (Signed)
New Outpatient Visit Date: 09/30/2020  Primary Care provider: Guy Begin, MD [address] [place],  [place] [address]  Chief Complaint: Evaluation of palpitations and prior atrial fibrillation  HPI:  [name] is a 55 y.o. female who is being seen today for the evaluation of palpitations and atrial fibrillation.. She has a history of paroxysmal atrial fibrillation and anxiety.  Approximately 2 years ago, the patient was in [place] for work and "drank too much."  The next day, she had nausea, emesis, and palpitations during which it felt like her heart was beating very fast and hard.  She ultimately called 911 and was found to be in atrial fibrillation.  She was hospitalized overnight.  She believes testing was done during her hospitalization, though she does not recall specifics.  She was told that she should avoid alcohol and other dietary triggers such as MSG.  Andrea Woods' palpitations date back even farther than this.  She believes that she saw a cardiologist about 8 years ago and was told that she had "tachycardia and mitral valve prolapse".  She continues to feel occasional skipped beats and flutters.  She also has an unusual sensation in her throat associated with this.  At times, it feels like her heart might stop for a second or 2.  It is somewhat reminiscent of what she experienced with her atrial fibrillation, though less sustained.  She describes a sensation of her heart feeling like a "washing machine off balance."  She notes associated wooziness but otherwise no symptoms.  She has had some random sharp chest pain not associated with palpitations that typically lasts for only a second or 2.  She has not had any shortness of breath, orthopnea, PND, or edema.  She consumes 1 to 2 cups of caffeinated coffee per day.   --------------------------------------------------------------------------------------------------  Cardiovascular History & Procedures: Cardiovascular  Problems:  Paroxysmal atrial fibrillation and palpitations  Risk Factors:  Prior tobacco use and family history  Cath/PCI:  None  CV Surgery:  None  EP Procedures and Devices:  None  Non-Invasive Evaluation(s):  None available.  Patient reports having had echo done a few years ago in the setting of her atrial fibrillation  Recent CV Pertinent Labs: Lab Results  Component Value Date   K 4.0 06/04/2020   K 3.7 05/08/2013   BUN 25 (H) 06/04/2020   BUN 15 05/08/2013   CREATININE 0.84 06/04/2020   CREATININE 0.70 05/08/2013    --------------------------------------------------------------------------------------------------  Past Medical History:  Diagnosis Date  . Anxiety   . Paroxysmal atrial fibrillation [facility])     Past Surgical History:  Procedure Laterality Date  . BREAST BIOPSY Right 2008   neg  . BREAST EXCISIONAL BIOPSY    . HAND SURGERY Bilateral    [facility] joint surgery  . TUBAL LIGATION      Current Meds  Medication Sig  . clonazePAM (KLONOPIN) 0.5 MG tablet Take 0.5 mg by mouth daily as needed.  Marland Kitchen estradiol (CLIMARA - DOSED IN MG/24 HR) 0.025 mg/24hr patch Place 0.025 mg onto the skin once a week.  . progesterone (PROMETRIUM) 100 MG capsule Take 100 mg by mouth daily.    Allergies: Patient has no known allergies.  Social History   Tobacco Use  . Smoking status: Former Smoker    Types: Cigarettes    Quit date: 2002    Years since quitting: 20.3  . Smokeless tobacco: Never Used  Vaping Use  . Vaping Use: Never used  Substance Use Topics  .  Alcohol use: Yes    Alcohol/week: 3.0 standard drinks    Types: 3 Glasses of wine per week  . Drug use: Never    Family History  Problem Relation Age of Onset  . Hypertension Mother   . Heart attack Mother 52  . Heart attack Father 64  . Hypertension Sister   . Hypertension Brother   . Hypertension Brother   . Heart disease Brother   . Breast cancer Neg Hx     Review of Systems: A 12-system  review of systems was performed and was negative except as noted in the HPI.  --------------------------------------------------------------------------------------------------  Physical Exam: BP (!) 150/104 (BP Location: Right Arm, Patient Position: Sitting, Cuff Size: Normal)   Pulse 67   Ht 5\' 4"  (1.626 m)   Wt 148 lb (67.1 kg)   LMP 09/17/2010   SpO2 98%   BMI 25.40 kg/m   Repeat BP: 158/96  General: NAD. HEENT: No conjunctival pallor or scleral icterus. Facemask in place. Neck: Supple without lymphadenopathy, thyromegaly, JVD, or HJR. No carotid bruit. Lungs: Normal work of breathing. Clear to auscultation bilaterally without wheezes or crackles. Heart: Regular rate and rhythm without murmurs, rubs, or gallops. Non-displaced PMI. Abd: Bowel sounds present. Soft, NT/ND without hepatosplenomegaly Ext: No lower extremity edema. Radial, PT, and DP pulses are 2+ bilaterally Skin: Warm and dry without rash. Neuro: CNIII-XII intact. Strength and fine-touch sensation intact in upper and lower extremities bilaterally. Psych: Normal mood and affect.  EKG: Normal sinus rhythm without significant abnormality.  Lab Results  Component Value Date   WBC 7.5 06/04/2020   HGB 14.0 06/04/2020   HCT 40.7 06/04/2020   MCV 86.8 06/04/2020   PLT 265 06/04/2020    Lab Results  Component Value Date   NA 137 06/04/2020   K 4.0 06/04/2020   CL 101 06/04/2020   CO2 27 06/04/2020   BUN 25 (H) 06/04/2020   CREATININE 0.84 06/04/2020   GLUCOSE 91 06/04/2020   ALT 23 06/04/2020    No results found for: CHOL, HDL, LDLCALC, LDLDIRECT, TRIG, CHOLHDL   --------------------------------------------------------------------------------------------------  ASSESSMENT AND PLAN: Palpitations and paroxysmal atrial fibrillation: Patient has a long history of intermittent palpitations.  Based on her description, these symptoms are most likely related to PACs or PVCs.  She reports an episode of atrial  fibrillation leading to ED visit about 2 years ago in 06/06/2020 in the setting of heavy alcohol consumption.  We have discussed further evaluation options, given her persistent symptoms, and have agreed to begin with a 14-day event monitor.  We will defer medication changes at this time.  Even if atrial fibrillation is identified, I think it would be reasonable to defer anticoagulation for now given a CHA2DS2-VASc score of 2 in a female (hypertension and gender).  I have encouraged her to moderate her caffeine and alcohol consumption.  Hypertension: The patient does not carry a diagnosis of hypertension though her blood pressure is elevated today.  Review of vital signs back to 2017 indicates frequent upper normal to mildly elevated readings.  We have discussed lifestyle modifications to help lower her blood pressure including sodium and alcohol restriction.  If blood pressure remains above 140/90 at follow-up, pharmacotherapy will need to be considered.  Given her palpitations, low-dose beta-blocker may be a good initial option for her.  Follow-up: Return to clinic in 6 weeks.  2018, MD 10/02/2020 6:54 AM

## 2020-10-02 ENCOUNTER — Encounter: Payer: Self-pay | Admitting: Internal Medicine

## 2020-10-02 DIAGNOSIS — I480 Paroxysmal atrial fibrillation: Secondary | ICD-10-CM | POA: Insufficient documentation

## 2020-10-02 DIAGNOSIS — R002 Palpitations: Secondary | ICD-10-CM | POA: Insufficient documentation

## 2020-10-02 DIAGNOSIS — I10 Essential (primary) hypertension: Secondary | ICD-10-CM | POA: Insufficient documentation

## 2020-10-21 ENCOUNTER — Ambulatory Visit: Payer: Managed Care, Other (non HMO) | Admitting: Internal Medicine

## 2020-10-26 ENCOUNTER — Telehealth: Payer: Self-pay | Admitting: *Deleted

## 2020-10-26 NOTE — Telephone Encounter (Signed)
-----   Message from Yvonne Kendall, MD sent at 10/26/2020 12:27 PM EDT ----- Please let the patient know that her monitor showed predominantly in normal rhythm with rare extra beats that may explain some of her symptoms.  There was no evidence of atrial fibrillation.  She should continue her current medications and follow-up as scheduled later this month to reassess her symptoms and determine if additional testing/intervention is necessary.

## 2020-10-26 NOTE — Telephone Encounter (Signed)
Attempted to call pt. No answer. Lmtcb.  

## 2020-10-27 NOTE — Telephone Encounter (Signed)
The patient has been notified of the result and verbalized understanding.  All questions (if any) were answered.     

## 2020-11-05 NOTE — Progress Notes (Signed)
Cardiology Office Note    Date:  11/12/2020   ID:  Andrea Woods, DOB 1965/11/06, MRN [id number]  PCP:  Guy Begin, MD  Cardiologist:  Yvonne Kendall, MD  Electrophysiologist:  None   Chief Complaint: Follow up  History of Present Illness:   Andrea Woods is a 55 y.o. female with history of prior A. fib, paroxysmal SVT, palpitations, HTN, and anxiety who presents for follow-up of Zio patch.  She established with Dr. Okey Dupre as a new patient in 09/2020.  She had previously seen a cardiologist approximately 8 years prior and was told she had "tachycardia and mitral valve prolapse".  She has a history of intermittent palpitations.  She reported being diagnosed with A. fib in [place] after drinking too much and required a brief hospitalization with specifics uncertain approximately 2 years prior.  She was told to avoid triggers such as alcohol and MSG.  Upon establishing with Dr. Okey Dupre, she continued to note intermittent palpitations and an unusual sensation in her throat which felt similar to what she experienced with her prior A. fib, though less sustained with episodes lasting 1 or 2 seconds.  Zio patch showed a predominant rhythm of sinus with an average heart rate of 76 bpm (range 55 to 165 bpm in sinus), rare PACs and PVCs, 3 atrial runs lasting up to 9 beats with a maximum rate of 169 bpm, no sustained arrhythmias or prolonged pauses, and no patient triggered events.  She comes in doing well from a cardiac perspective.  No chest pain, dyspnea, dizziness, presyncope, or syncope.  She does continue to note intermittent randomly occurring palpitations which will last for several seconds in duration and are largely unchanged when compared to her last visit.  She does drink 1 cup of coffee on weekdays and at times will have 1 to 3 cups of coffee throughout the day on the weekends.  Historically she has wine on Friday evenings, at times up to a full bottle.  However, since her last cardiology visit  she has worked on decreasing this alcohol consumption.  Review of prior BP readings shows her BP is frequently in the high normal to mildly elevated range.  She has been hesitant to pursue antihypertensive medications historically.  She prefers to work on diet and lifestyle modifications including exercise.  She recently joined a [employer].  She does report a history of snoring and waking up gasping for air at nighttime as well as increased fatigue/daytime somnolence.    Sleep Apnea Evaluation   STOP-BANG RISK ASSESSMENT    STOP-BANG 11/12/2020  Do you snore loudly? Yes  Do you often feel tired, fatigued, or sleepy during the daytime? Yes  Has anyone observed you stop breathing during sleep? Yes  Do you have (or are you being treated for) high blood pressure? No  Recent BMI (Calculated) 25.76  Is BMI greater than 35 kg/m2? 0=No  Age older than 57 years old? 1=Yes  Has large neck size > 40 cm (15.7 in, large female shirt size, large female collar size > 16) No  Gender - Female 0=No  STOP-Bang Total Score 4        Labs independently reviewed: 07/2020 -TC 228, TG 79, HDL 62, LDL 152 05/2020 - Hgb 14.0, PLT 265, potassium 4.0, BUN 25, serum creatinine 0.84, albumin 4.4, AST/ALT normal   Past Medical History:  Diagnosis Date   Anxiety    Paroxysmal atrial fibrillation [facility])     Past Surgical History:  Procedure Laterality Date  BREAST BIOPSY Right 2008   neg   BREAST EXCISIONAL BIOPSY     HAND SURGERY Bilateral    [facility] joint surgery   TUBAL LIGATION      Current Medications: Current Meds  Medication Sig   clonazePAM (KLONOPIN) 0.5 MG tablet Take 0.5 mg by mouth daily as needed.   estradiol (CLIMARA - DOSED IN MG/24 HR) 0.025 mg/24hr patch Place 0.025 mg onto the skin once a week.   progesterone (PROMETRIUM) 100 MG capsule Take 100 mg by mouth daily.    Allergies:   Patient has no known allergies.   Social History   Socioeconomic History   Marital status: Married    Spouse  name: Not on file   Number of children: Not on file   Years of education: Not on file   Highest education level: Not on file  Occupational History   Not on file  Tobacco Use   Smoking status: Former    Pack years: 0.00    Types: Cigarettes    Quit date: 2002    Years since quitting: 20.4   Smokeless tobacco: Never  Vaping Use   Vaping Use: Never used  Substance and Sexual Activity   Alcohol use: Yes    Alcohol/week: 3.0 standard drinks    Types: 3 Glasses of wine per week   Drug use: Never   Sexual activity: Yes  Other Topics Concern   Not on file  Social History Narrative   Not on file   Social Determinants of Health   Financial Resource Strain: Not on file  Food Insecurity: Not on file  Transportation Needs: Not on file  Physical Activity: Not on file  Stress: Not on file  Social Connections: Not on file     Family History:  The patient's family history includes Heart attack (age of onset: 1752) in her father; Heart attack (age of onset: 5383) in her mother; Heart disease in her brother; Hypertension in her brother, brother, mother, and sister. There is no history of Breast cancer.  ROS:   Review of Systems  Constitutional:  Positive for malaise/fatigue. Negative for chills, diaphoresis, fever and weight loss.  HENT:  Negative for congestion.   Eyes:  Negative for discharge and redness.  Respiratory:  Negative for cough, sputum production, shortness of breath and wheezing.   Cardiovascular:  Positive for palpitations. Negative for chest pain, orthopnea, claudication, leg swelling and PND.  Gastrointestinal:  Negative for abdominal pain, heartburn, nausea and vomiting.  Musculoskeletal:  Negative for falls and myalgias.  Skin:  Negative for rash.  Neurological:  Negative for dizziness, tingling, tremors, sensory change, speech change, focal weakness, loss of consciousness and weakness.  Endo/Heme/Allergies:  Does not bruise/bleed easily.  Psychiatric/Behavioral:   Negative for substance abuse. The patient is not nervous/anxious.   All other systems reviewed and are negative.   EKGs/Labs/Other Studies Reviewed:    Studies reviewed were summarized above. The additional studies were reviewed today:  Zio patch 09/2020: The patient was monitored for 14 days. The predominant rhythm was sinus with an average rate of 76 bpm (range 55-165 bpm and sinus). There were rare PACs and PVCs. Three atrial runs lasting up to 9 beats with a maximum rate of 169 bpm were observed. There was no sustained arrhythmia or prolonged pause. No patient triggered events were noted.   Predominantly sinus rhythm with rare PACs, PVCs, and brief PSVT.    EKG:  EKG is not ordered today.    Recent Labs: 06/04/2020:  ALT 23; BUN 25; Creatinine, Ser 0.84; Hemoglobin 14.0; Platelets 265; Potassium 4.0; Sodium 137  Recent Lipid Panel No results found for: CHOL, TRIG, HDL, CHOLHDL, VLDL, LDLCALC, LDLDIRECT  PHYSICAL EXAM:    VS:  BP (!) 140/100 (BP Location: Left Arm, Patient Position: Sitting, Cuff Size: Normal)   Pulse 70   Ht 5\' 4"  (1.626 m)   Wt 150 lb 2 oz (68.1 kg)   LMP 09/17/2010   SpO2 98%   BMI 25.77 kg/m   BMI: Body mass index is 25.77 kg/m.  Physical Exam Vitals reviewed.  Constitutional:      Appearance: She is well-developed.  HENT:     Head: Normocephalic and atraumatic.  Eyes:     General:        Right eye: No discharge.        Left eye: No discharge.  Neck:     Vascular: No JVD.  Cardiovascular:     Rate and Rhythm: Normal rate and regular rhythm.     Pulses:          Posterior tibial pulses are 2+ on the right side and 2+ on the left side.     Heart sounds: Normal heart sounds, S1 normal and S2 normal. Heart sounds not distant. No midsystolic click and no opening snap. No murmur heard.   No friction rub.  Pulmonary:     Effort: Pulmonary effort is normal. No respiratory distress.     Breath sounds: Normal breath sounds. No decreased breath  sounds, wheezing or rales.  Chest:     Chest wall: No tenderness.  Abdominal:     General: There is no distension.     Palpations: Abdomen is soft.     Tenderness: There is no abdominal tenderness.  Musculoskeletal:     Cervical back: Normal range of motion.  Skin:    General: Skin is warm and dry.     Nails: There is no clubbing.  Neurological:     Mental Status: She is alert and oriented to person, place, and time.  Psychiatric:        Speech: Speech normal.        Behavior: Behavior normal.        Thought Content: Thought content normal.        Judgment: Judgment normal.    Wt Readings from Last 3 Encounters:  11/12/20 150 lb 2 oz (68.1 kg)  09/30/20 148 lb (67.1 kg)  06/04/20 145 lb (65.8 kg)     ASSESSMENT & PLAN:   Paroxysmal SVT/palpitations/reported lone A. fib: Recent Zio patch demonstrated rare PACs and PVCs with 4 brief atrial runs with the longest episode lasting just 9 beats.  Given the infrequent nature of her atrial ectopy, and in the context of her preference to avoid medications we will continue to monitor symptoms.  Look to check a TSH when she is seen in follow-up.  Elevated blood-pressure reading without diagnosis of hypertension: Blood pressure does remain elevated though she really prefers to avoid medications at this time.  She indicates she has recently joined a gym and will work on lifestyle modification.  We will pursue a sleep study as outlined below as this may be contributing.  Low-sodium diet along with caffeine, and alcohol reduction recommended  Sleep disordered breathing: STOP-BANG score of 4 as outlined above.  Possibly contributing to her elevated BP readings and atrial arrhythmia.  Schedule WatchPAT sleep study.  Disposition: F/u with Dr. 06/06/20 or an APP in 3 months. Okey Dupre  Medication Adjustments/Labs and Tests Ordered: Current medicines are reviewed at length with the patient today.  Concerns regarding medicines are outlined above. Medication  changes, Labs and Tests ordered today are summarized above and listed in the Patient Instructions accessible in Encounters.   Signed, Eula Listen, PA-C 11/12/2020 4:13 PM     [facility] [place] [address] [place], [place] [address] [phone]

## 2020-11-12 ENCOUNTER — Other Ambulatory Visit: Payer: Self-pay

## 2020-11-12 ENCOUNTER — Encounter: Payer: Self-pay | Admitting: Physician Assistant

## 2020-11-12 ENCOUNTER — Ambulatory Visit: Payer: Managed Care, Other (non HMO) | Admitting: Physician Assistant

## 2020-11-12 VITALS — BP 140/100 | HR 70 | Ht 64.0 in | Wt 150.1 lb

## 2020-11-12 DIAGNOSIS — I4891 Unspecified atrial fibrillation: Secondary | ICD-10-CM

## 2020-11-12 DIAGNOSIS — R002 Palpitations: Secondary | ICD-10-CM | POA: Diagnosis not present

## 2020-11-12 DIAGNOSIS — R030 Elevated blood-pressure reading, without diagnosis of hypertension: Secondary | ICD-10-CM | POA: Diagnosis not present

## 2020-11-12 DIAGNOSIS — I471 Supraventricular tachycardia: Secondary | ICD-10-CM | POA: Diagnosis not present

## 2020-11-12 DIAGNOSIS — G4730 Sleep apnea, unspecified: Secondary | ICD-10-CM

## 2020-11-12 NOTE — Patient Instructions (Signed)
Medication Instructions:  No changes at this time.  *If you need a refill on your cardiac medications before your next appointment, please call your pharmacy*   Lab Work: None  If you have labs (blood work) drawn today and your tests are completely normal, you will receive your results only by: MyChart Message (if you have MyChart) OR A paper copy in the mail If you have any lab test that is abnormal or we need to change your treatment, we will call you to review the results.   Testing/Procedures: WatchPAT?  Is a FDA cleared portable home sleep study test that uses a watch and 3 points of contact to monitor 7 different channels, including your heart rate, oxygen saturations, body position, snoring, and chest motion.  The study is easy to use from the comfort of your own home and accurately detect sleep apnea.  Before bed, you attach the chest sensor, attached the sleep apnea bracelet to your nondominant hand, and attach the finger probe.  After the study, the raw data is downloaded from the watch and scored for apnea events.   For more information: [email or web address]    Follow-Up: At [facility], you and your health needs are our priority.  As part of our continuing mission to provide you with exceptional heart care, we have created designated Provider Care Teams.  These Care Teams include your primary Cardiologist (physician) and Advanced Practice Providers (APPs -  Physician Assistants and Nurse Practitioners) who all work together to provide you with the care you need, when you need it.  We recommend signing up for the patient portal called "MyChart".  Sign up information is provided on this After Visit Summary.  MyChart is used to connect with patients for Virtual Visits (Telemedicine).  Patients are able to view lab/test results, encounter notes, upcoming appointments, etc.  Non-urgent messages can be sent to your provider as well.   To learn more about what  you can do with MyChart, go to [email or web address].    Your next appointment:   3 month(s)  The format for your next appointment:   In Person  Provider:   Yvonne Kendall, MD or Eula Listen, PA-C

## 2020-11-16 ENCOUNTER — Telehealth: Payer: Self-pay | Admitting: Physician Assistant

## 2020-11-16 NOTE — Telephone Encounter (Signed)
Patient calling in to check on her auth for the watchpat. Patient just did not want to miss a call but states if she is called and doesn't answer, a voicemail can be left

## 2020-11-17 NOTE — Telephone Encounter (Signed)
Spoke to pt. Made aware that precert can take several days, however, I will forward message to precert team to follow up. Notified pt I do not see noted in chart that we have received authorization but someone from our office will be sure to call her once we receive authorization.   Pt appreciative and voiced understanding.  No further needs at this time.

## 2020-11-19 ENCOUNTER — Telehealth: Payer: Self-pay | Admitting: *Deleted

## 2020-11-19 NOTE — Telephone Encounter (Signed)
Spoke with patient and advised she is able to proceed with the WatchPat device and provided her with pin number. She was appreciative for the call with no further questions at this time.

## 2020-11-19 NOTE — Telephone Encounter (Signed)
Staff message sent to Andrea Woods per patient's insurance no PA is required. Ok for patient to activate Watchpat PIN. [employer] call reference # [id number].

## 2020-11-19 NOTE — Telephone Encounter (Signed)
-----   Message from Gaynelle Cage, CMA sent at 11/19/2020 10:04 AM EDT ----- Regarding: RE: Prior Auth Per patient's insurance no PA is required. Ok to activate PIN. ----- Message ----- From: Bryna Colander, RN Sent: 11/12/2020   5:03 PM EDT To: [facility] Sleep Studies Subject: Prior Auth                                     Precert for WatchPat device.

## 2020-11-19 NOTE — Telephone Encounter (Signed)
-----   Message from Bryna Colander, RN sent at 11/12/2020  5:02 PM EDT ----- Regarding: Prior Auth Precert for [employer] device.

## 2020-11-28 ENCOUNTER — Encounter (INDEPENDENT_AMBULATORY_CARE_PROVIDER_SITE_OTHER): Payer: Managed Care, Other (non HMO) | Admitting: Cardiology

## 2020-11-28 DIAGNOSIS — I480 Paroxysmal atrial fibrillation: Secondary | ICD-10-CM

## 2020-11-28 DIAGNOSIS — I10 Essential (primary) hypertension: Secondary | ICD-10-CM | POA: Diagnosis not present

## 2020-12-07 ENCOUNTER — Encounter: Payer: Self-pay | Admitting: Physician Assistant

## 2020-12-25 NOTE — Procedures (Signed)
   Sleep Study Report  Patient Information Study Date: 11/28/20 Patient Name: Andrea Woods Patient ID: [id number] Birth Date: 05-16-2066 Age: 55 Gender: Female BMI: 25.6 (W=150 lb, H=5' 4'') Referring Physician: Eula Listen, PA  TEST DESCRIPTION: Home sleep apnea testing was completed using the WatchPat, a Type 1 device, utilizing  peripheral arterial tonometry (PAT), chest movement, actigraphy, pulse oximetry, pulse rate, body position and snore.  AHI was calculated with apnea and hypopnea using valid sleep time as the denominator. RDI includes apneas,  hypopneas, and RERAs. The data acquired and the scoring of sleep and all associated events were performed in  accordance with the recommended standards and specifications as outlined in the AASM Manual for the Scoring of  Sleep and Associated Events 2.2.0 (2015).   FINDINGS: 1. No evidence of Obstructive Sleep Apnea with AHI 1.8/hr.  2. No Central Sleep Apnea. 3. Oxygen desaturations as low as 90%. 4. Minimal snoring was present. O2 sats were < 88% for 0 minutes. 5. Total sleep time was 9 hrs and 3 min. 6. 32.1% of total sleep time was spent in REM sleep.  7. Normal sleep onset latency at 16 min.  8. Normal REM sleep onset latency at 98 min.  9. Total awakenings were 5.   DIAGNOSIS:  Normal study with no significant sleep disordered breathing.  RECOMMENDATIONS:  1. Normal study with no significant sleep disordered breathing.  2. Healthy sleep recommendations include: adequate nightly sleep (normal 7-9 hrs/night), avoidance of caffeine after  noon and alcohol near bedtime, and maintaining a sleep environment that is cool, dark and quiet. 3. Weight loss for overweight patients is recommended.   4. Snoring recommendations include: weight loss where appropriate, side sleeping, and avoidance of alcohol before  bed.  5. Operation of motor vehicle or dangerous equipment must be avoided when feeling drowsy, excessively sleepy, or   mentally fatigued.   6. An ENT consultation which may be useful for specific causes of and possible treatment of bothersome snoring .   7. Weight loss may be of benefit in reducing the severity of snoring.   Signature: Electronically Signed: 12/26/20 Armanda Magic, MD; [facility]; Diplomat, American Board of Sleep Medicine Report prepared by: Armanda Magic, MD

## 2020-12-26 ENCOUNTER — Ambulatory Visit: Payer: Managed Care, Other (non HMO)

## 2020-12-26 DIAGNOSIS — R002 Palpitations: Secondary | ICD-10-CM

## 2020-12-26 DIAGNOSIS — I4891 Unspecified atrial fibrillation: Secondary | ICD-10-CM

## 2021-01-04 ENCOUNTER — Telehealth: Payer: Self-pay | Admitting: *Deleted

## 2021-01-04 NOTE — Telephone Encounter (Signed)
-----   Message from Quintella Reichert, MD sent at 12/25/2020 11:29 PM EDT ----- Normal home sleep study so in lab PSG will be ordered

## 2021-01-04 NOTE — Telephone Encounter (Signed)
Prior Authorization for NPSG sent to [employer] via Fax. Reference # [id number]. FAX #[phone].  Clinicals faxed. NPSG lab

## 2021-01-08 NOTE — Telephone Encounter (Addendum)
In lab sleep study denied by [employer]. Not medically necessary.

## 2021-01-11 NOTE — Telephone Encounter (Signed)
The patient has been notified of the result. Left detailed message on voicemail and informed patient to call back with questions.  Latrelle Dodrill, CMA 01/11/2021 2:34 PM

## 2021-01-12 NOTE — Telephone Encounter (Signed)
Please notify patient we can discuss if further sleep evaluation is needed at her visit next month.

## 2021-01-12 NOTE — Telephone Encounter (Signed)
Spoke with patient and reviewed that we can review discuss if further sleep evaluation is needed at her upcoming visit. Confirmed date and time for that appointment as well. She was appreciative for the call with no further questions at this time.

## 2021-02-12 ENCOUNTER — Ambulatory Visit: Payer: Managed Care, Other (non HMO) | Admitting: Physician Assistant

## 2021-03-30 NOTE — Progress Notes (Deleted)
Cardiology Office Note    Date:  03/30/2021   ID:  Andrea Woods, DOB September 08, 1965, MRN [id number]  PCP:  Guy Begin, MD  Cardiologist:  Yvonne Kendall, MD  Electrophysiologist:  None   Chief Complaint: Follow up  History of Present Illness:   Andrea Woods is a 55 y.o. female with history of prior A. fib, paroxysmal SVT, palpitations, HTN, and anxiety who presents for follow-up of paroxysmal SVT.   She established with Dr. Okey Dupre as a new patient in 09/2020.  She had previously seen a cardiologist approximately 8 years prior and was told she had "tachycardia and mitral valve prolapse".  She has a history of intermittent palpitations.  She reported being diagnosed with A. fib in [place] after drinking too much and required a brief hospitalization with specifics uncertain approximately 2 years prior.  She was told to avoid triggers such as alcohol and MSG.  Upon establishing with Dr. Okey Dupre, she continued to note intermittent palpitations and an unusual sensation in her throat which felt similar to what she experienced with her prior A. fib, though less sustained with episodes lasting 1 or 2 seconds.  Zio patch showed a predominant rhythm of sinus with an average heart rate of 76 bpm (range 55 to 165 bpm in sinus), rare PACs and PVCs, 3 atrial runs lasting up to 9 beats with a maximum rate of 169 bpm, no sustained arrhythmias or prolonged pauses, and no patient triggered events.  She was last seen in the office in 10/2020 and was doing well from a cardiac perspective.  She did continue to note intermittently randomly occurring palpitations which would last for several seconds in duration and were largely unchanged.  She was working on decreasing her caffeine and alcohol intake.  She was interested in pursuing lifestyle modification with regards to mildly elevated blood pressure readings.  Given concerns for sleep disordered breathing, she underwent WatchPAT, which showed no evidence of sleep  apnea.  ***   Labs independently reviewed: 07/2020 -TC 228, TG 79, HDL 62, LDL 152 05/2020 - Hgb 14.0, PLT 265, potassium 4.0, BUN 25, serum creatinine 0.84, albumin 4.4, AST/ALT normal  Past Medical History:  Diagnosis Date   Anxiety    Paroxysmal atrial fibrillation [facility])     Past Surgical History:  Procedure Laterality Date   BREAST BIOPSY Right 2008   neg   BREAST EXCISIONAL BIOPSY     HAND SURGERY Bilateral    [facility] joint surgery   TUBAL LIGATION      Current Medications: No outpatient medications have been marked as taking for the 04/02/21 encounter (Appointment) with Sondra Barges, PA-C.    Allergies:   Patient has no known allergies.   Social History   Socioeconomic History   Marital status: Married    Spouse name: Not on file   Number of children: Not on file   Years of education: Not on file   Highest education level: Not on file  Occupational History   Not on file  Tobacco Use   Smoking status: Former    Types: Cigarettes    Quit date: 2002    Years since quitting: 20.8   Smokeless tobacco: Never  Vaping Use   Vaping Use: Never used  Substance and Sexual Activity   Alcohol use: Yes    Alcohol/week: 3.0 standard drinks    Types: 3 Glasses of wine per week   Drug use: Never   Sexual activity: Yes  Other Topics Concern   Not  on file  Social History Narrative   Not on file   Social Determinants of Health   Financial Resource Strain: Not on file  Food Insecurity: Not on file  Transportation Needs: Not on file  Physical Activity: Not on file  Stress: Not on file  Social Connections: Not on file     Family History:  The patient's family history includes Heart attack (age of onset: 10) in her father; Heart attack (age of onset: 51) in her mother; Heart disease in her brother; Hypertension in her brother, brother, mother, and sister. There is no history of Breast cancer.  ROS:   ROS   EKGs/Labs/Other Studies Reviewed:    Studies reviewed  were summarized above. The additional studies were reviewed today:  Zio patch 09/2020: The patient was monitored for 14 days. The predominant rhythm was sinus with an average rate of 76 bpm (range 55-165 bpm and sinus). There were rare PACs and PVCs. Three atrial runs lasting up to 9 beats with a maximum rate of 169 bpm were observed. There was no sustained arrhythmia or prolonged pause. No patient triggered events were noted.   Predominantly sinus rhythm with rare PACs, PVCs, and brief PSVT.    EKG:  EKG is ordered today.  The EKG ordered today demonstrates ***  Recent Labs: 06/04/2020: ALT 23; BUN 25; Creatinine, Ser 0.84; Hemoglobin 14.0; Platelets 265; Potassium 4.0; Sodium 137  Recent Lipid Panel No results found for: CHOL, TRIG, HDL, CHOLHDL, VLDL, LDLCALC, LDLDIRECT  PHYSICAL EXAM:    VS:  LMP 09/17/2010   BMI: There is no height or weight on file to calculate BMI.  Physical Exam  Wt Readings from Last 3 Encounters:  11/12/20 150 lb 2 oz (68.1 kg)  09/30/20 148 lb (67.1 kg)  06/04/20 145 lb (65.8 kg)     ASSESSMENT & PLAN:   Paroxysmal SVT/palpitations/reported lone A. fib:  Elevated blood-pressure reading without diagnosis of hypertension:  Disposition: F/u with Dr. Okey Dupre or an APP in ***.   Medication Adjustments/Labs and Tests Ordered: Current medicines are reviewed at length with the patient today.  Concerns regarding medicines are outlined above. Medication changes, Labs and Tests ordered today are summarized above and listed in the Patient Instructions accessible in Encounters.   Signed, Eula Listen, PA-C 03/30/2021 12:15 PM     [facility] [place] [address] [place], [place] [address] [phone]

## 2021-04-02 ENCOUNTER — Ambulatory Visit: Payer: Managed Care, Other (non HMO) | Admitting: Physician Assistant

## 2021-04-15 NOTE — Progress Notes (Deleted)
Cardiology Office Note    Date:  04/15/2021   ID:  Andrea Woods, DOB 10-28-1965, MRN [id number]  PCP:  Guy Begin, MD  Cardiologist:  Yvonne Kendall, MD  Electrophysiologist:  None   Chief Complaint: Follow-up  History of Present Illness:   Andrea Woods is a 55 y.o. female with history of prior A. fib, paroxysmal SVT, palpitations, HTN, and anxiety who presents for follow-up of paroxysmal SVT.   She established with Dr. Okey Dupre as a new patient in 09/2020.  She had previously seen a cardiologist approximately 8 years prior and was told she had "tachycardia and mitral valve prolapse".  She has a history of intermittent palpitations.  She reported being diagnosed with A. fib in [place] after drinking too much and required a brief hospitalization with specifics uncertain approximately 2 years prior.  She was told to avoid triggers such as alcohol and MSG.  Upon establishing with Dr. Okey Dupre, she continued to note intermittent palpitations and an unusual sensation in her throat which felt similar to what she experienced with her prior A. fib, though less sustained with episodes lasting 1 or 2 seconds.  Zio patch showed a predominant rhythm of sinus with an average heart rate of 76 bpm (range 55 to 165 bpm in sinus), rare PACs and PVCs, 3 atrial runs lasting up to 9 beats with a maximum rate of 169 bpm, no sustained arrhythmias or prolonged pauses, and no patient triggered events.  She was last seen in the office in 10/2020 and was doing well from a cardiac perspective.  She did continue to note intermittently randomly occurring palpitations which would last for several seconds in duration and were largely unchanged.  She was working on decreasing her caffeine and alcohol intake.  She was interested in pursuing lifestyle modification with regards to mildly elevated blood pressure readings.  Given concerns for sleep disordered breathing, she underwent WatchPAT, which showed no evidence of sleep  apnea.  ***   Labs independently reviewed: 07/2020 -TC 228, TG 79, HDL 62, LDL 152 05/2020 - Hgb 14.0, PLT 265, potassium 4.0, BUN 25, serum creatinine 0.84, albumin 4.4, AST/ALT normal    Past Medical History:  Diagnosis Date   Anxiety    Paroxysmal atrial fibrillation [facility])     Past Surgical History:  Procedure Laterality Date   BREAST BIOPSY Right 2008   neg   BREAST EXCISIONAL BIOPSY     HAND SURGERY Bilateral    [facility] joint surgery   TUBAL LIGATION      Current Medications: No outpatient medications have been marked as taking for the 04/23/21 encounter (Appointment) with Sondra Barges, PA-C.    Allergies:   Patient has no known allergies.   Social History   Socioeconomic History   Marital status: Married    Spouse name: Not on file   Number of children: Not on file   Years of education: Not on file   Highest education level: Not on file  Occupational History   Not on file  Tobacco Use   Smoking status: Former    Types: Cigarettes    Quit date: 2002    Years since quitting: 20.9   Smokeless tobacco: Never  Vaping Use   Vaping Use: Never used  Substance and Sexual Activity   Alcohol use: Yes    Alcohol/week: 3.0 standard drinks    Types: 3 Glasses of wine per week   Drug use: Never   Sexual activity: Yes  Other Topics Concern  Not on file  Social History Narrative   Not on file   Social Determinants of Health   Financial Resource Strain: Not on file  Food Insecurity: Not on file  Transportation Needs: Not on file  Physical Activity: Not on file  Stress: Not on file  Social Connections: Not on file     Family History:  The patient's family history includes Heart attack (age of onset: 65) in her father; Heart attack (age of onset: 69) in her mother; Heart disease in her brother; Hypertension in her brother, brother, mother, and sister. There is no history of Breast cancer.  ROS:   ROS   EKGs/Labs/Other Studies Reviewed:    Studies reviewed  were summarized above. The additional studies were reviewed today:  Zio patch 09/2020: The patient was monitored for 14 days. The predominant rhythm was sinus with an average rate of 76 bpm (range 55-165 bpm and sinus). There were rare PACs and PVCs. Three atrial runs lasting up to 9 beats with a maximum rate of 169 bpm were observed. There was no sustained arrhythmia or prolonged pause. No patient triggered events were noted.   Predominantly sinus rhythm with rare PACs, PVCs, and brief PSVT.   EKG:  EKG is ordered today.  The EKG ordered today demonstrates ***  Recent Labs: 06/04/2020: ALT 23; BUN 25; Creatinine, Ser 0.84; Hemoglobin 14.0; Platelets 265; Potassium 4.0; Sodium 137  Recent Lipid Panel No results found for: CHOL, TRIG, HDL, CHOLHDL, VLDL, LDLCALC, LDLDIRECT  PHYSICAL EXAM:    VS:  LMP 09/17/2010   BMI: There is no height or weight on file to calculate BMI.  Physical Exam  Wt Readings from Last 3 Encounters:  11/12/20 150 lb 2 oz (68.1 kg)  09/30/20 148 lb (67.1 kg)  06/04/20 145 lb (65.8 kg)     ASSESSMENT & PLAN:   Paroxysmal SVT/palpitations/reported lone A. fib:  Elevated blood-pressure reading without diagnosis of hypertension:   {Are you ordering a CV Procedure (e.g. stress test, cath, DCCV, TEE, etc)?   Press F2        :[id number]}     Disposition: F/u with Dr. Okey Dupre or an APP in ***.   Medication Adjustments/Labs and Tests Ordered: Current medicines are reviewed at length with the patient today.  Concerns regarding medicines are outlined above. Medication changes, Labs and Tests ordered today are summarized above and listed in the Patient Instructions accessible in Encounters.   SignedEula Listen, PA-C 04/15/2021 11:02 AM     [facility] [place] [address] [place], [place] [address] [phone]

## 2021-04-23 ENCOUNTER — Ambulatory Visit: Payer: Managed Care, Other (non HMO) | Admitting: Physician Assistant

## 2021-04-26 ENCOUNTER — Encounter: Payer: Self-pay | Admitting: Physician Assistant

## 2021-06-10 ENCOUNTER — Other Ambulatory Visit: Payer: Self-pay | Admitting: Family Medicine

## 2021-06-10 DIAGNOSIS — Z1231 Encounter for screening mammogram for malignant neoplasm of breast: Secondary | ICD-10-CM

## 2021-09-03 ENCOUNTER — Ambulatory Visit
Admission: RE | Admit: 2021-09-03 | Discharge: 2021-09-03 | Disposition: A | Discharge status: Home / Self Care | Payer: Managed Care, Other (non HMO) | Source: Ambulatory Visit | Attending: Family Medicine | Admitting: Family Medicine

## 2021-09-03 DIAGNOSIS — Z1231 Encounter for screening mammogram for malignant neoplasm of breast: Secondary | ICD-10-CM | POA: Diagnosis not present

## 2023-10-11 ENCOUNTER — Encounter: Payer: Self-pay | Admitting: Family Medicine

## 2023-10-24 ENCOUNTER — Encounter: Payer: Self-pay | Admitting: Family Medicine

## 2023-11-23 ENCOUNTER — Other Ambulatory Visit: Payer: Self-pay

## 2023-11-23 DIAGNOSIS — Z1231 Encounter for screening mammogram for malignant neoplasm of breast: Secondary | ICD-10-CM

## 2023-12-25 ENCOUNTER — Ambulatory Visit: Payer: Self-pay | Attending: Obstetrics and Gynecology

## 2023-12-25 ENCOUNTER — Ambulatory Visit: Payer: Self-pay

## 2023-12-25 ENCOUNTER — Telehealth: Payer: Self-pay | Admitting: *Deleted

## 2024-01-19 IMAGING — MG MM DIGITAL SCREENING BILAT W/ TOMO AND CAD
8 series · 8 of 24 positions shown · non-contrast
Comparison: Previous exam(s).

CLINICAL DATA: Screening.

EXAM:
DIGITAL SCREENING BILATERAL MAMMOGRAM WITH TOMOSYNTHESIS AND CAD
TECHNIQUE: Bilateral screening digital craniocaudal and mediolateral oblique
mammograms were obtained. Bilateral screening digital breast
tomosynthesis was performed. The images were evaluated with
computer-aided detection.

[R CC synth-2D]
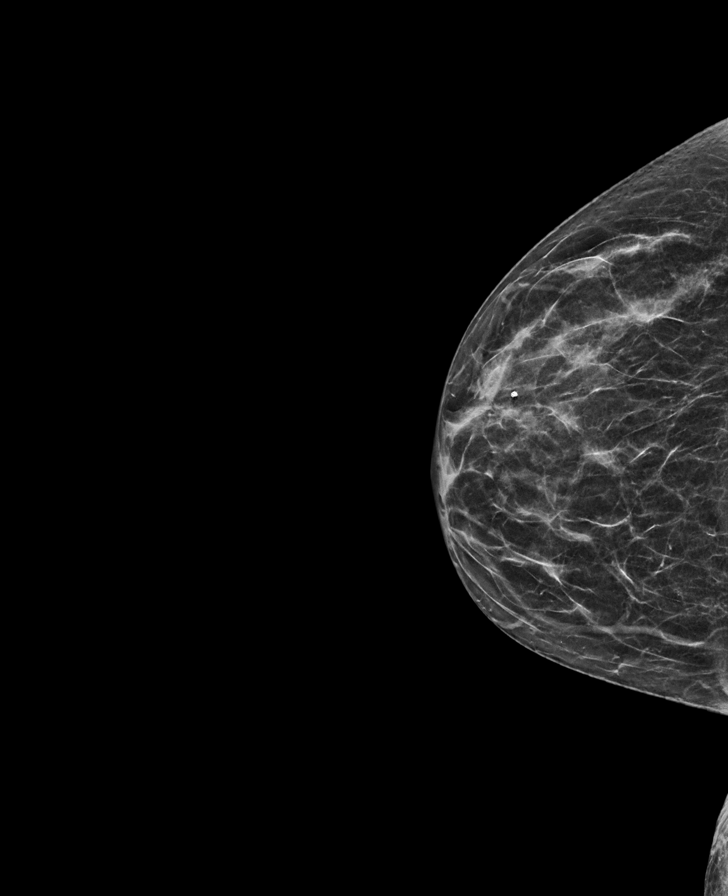

[R MLO synth-2D]
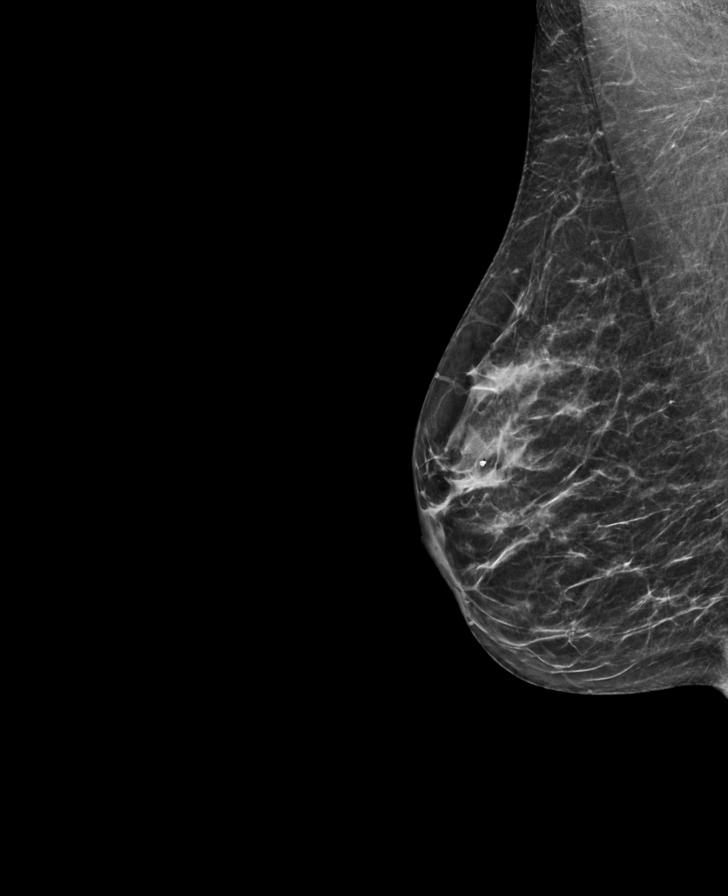

[L MLO synth-2D]
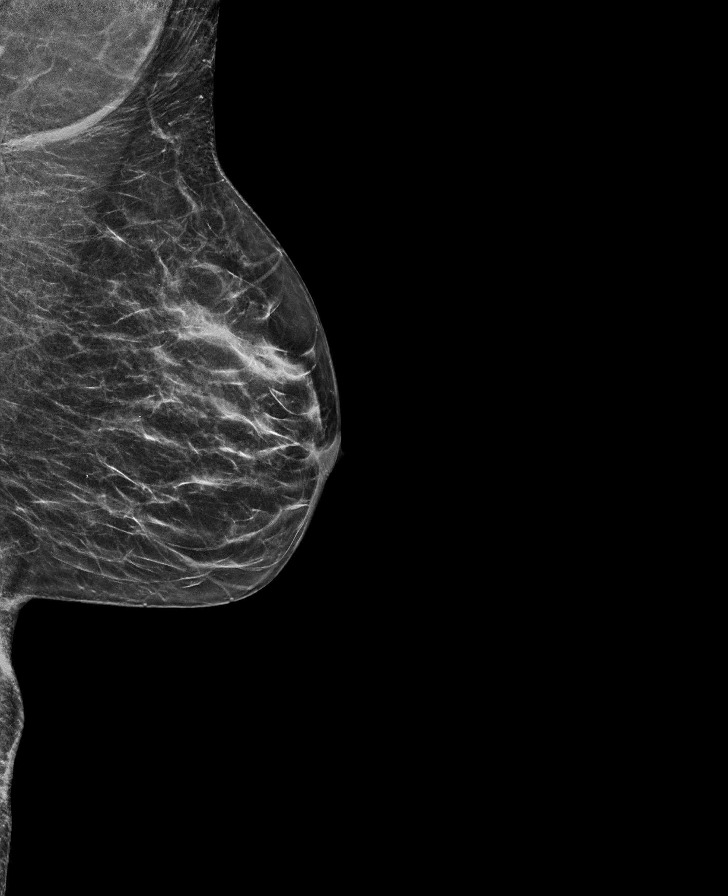

[L CC synth-2D]
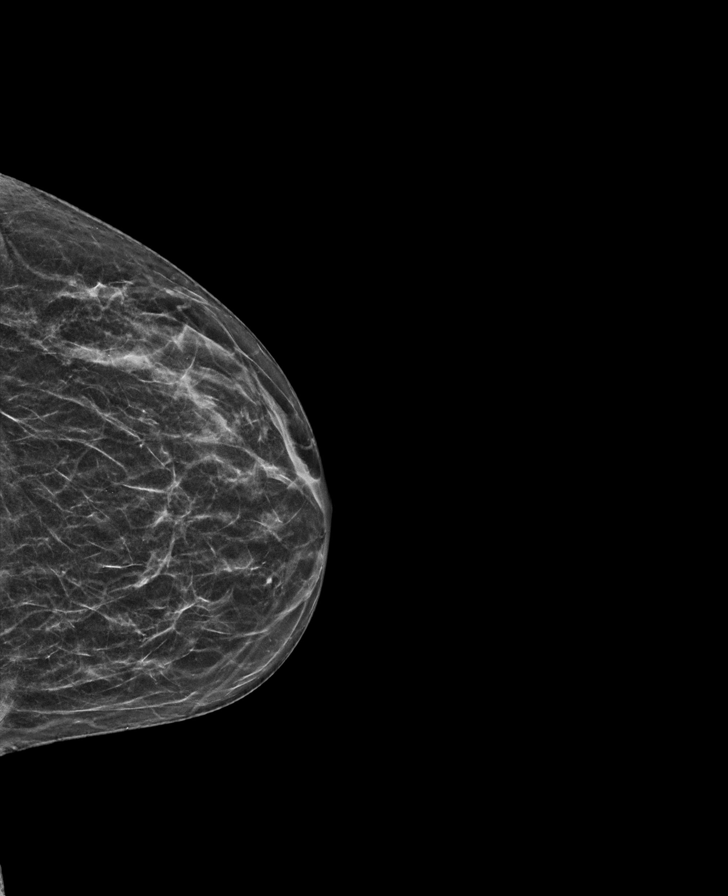

[L MLO tomo · tomo slice 27/54.0]
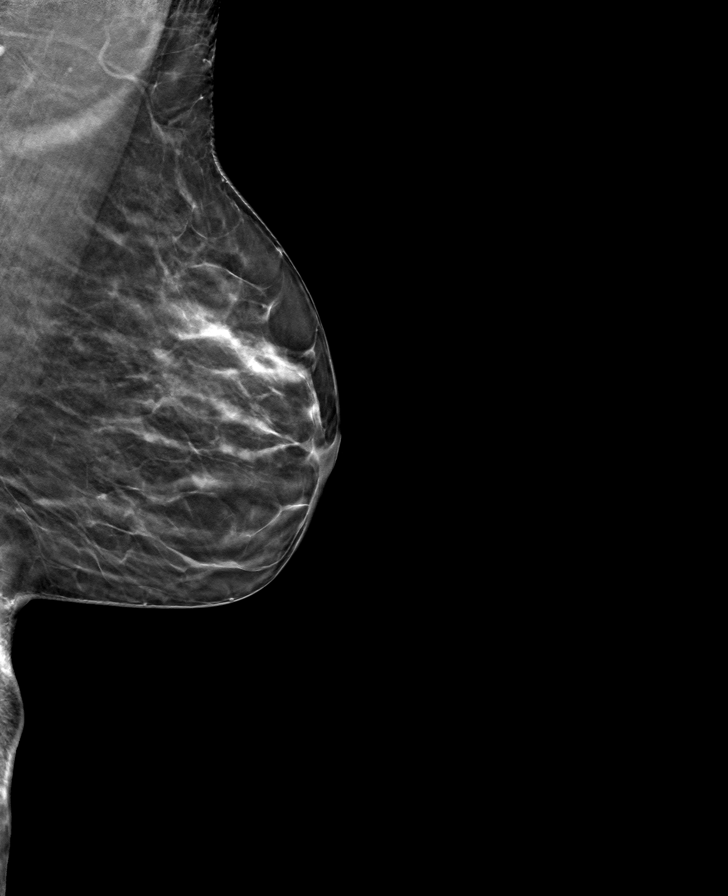

[R MLO tomo · tomo slice 27/52.0]
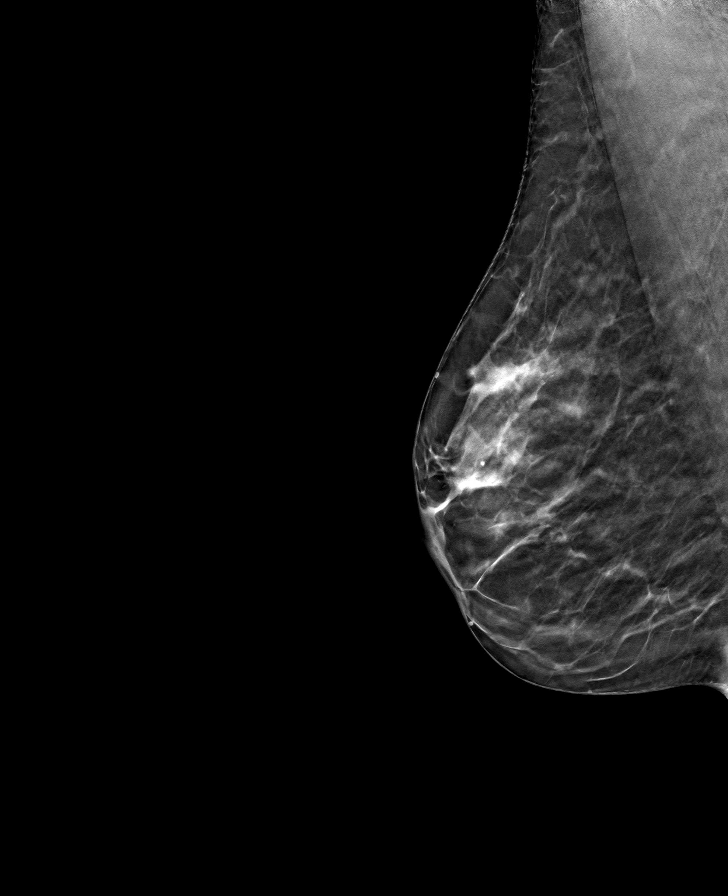

[R CC tomo · tomo slice 25/49.0]
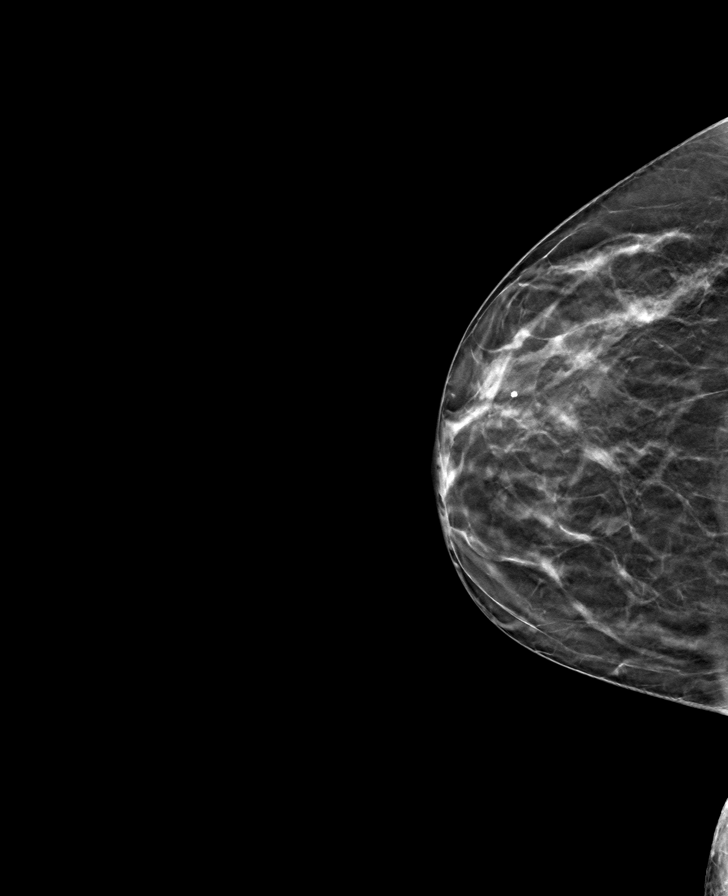

[L CC tomo · tomo slice 24/47.0]
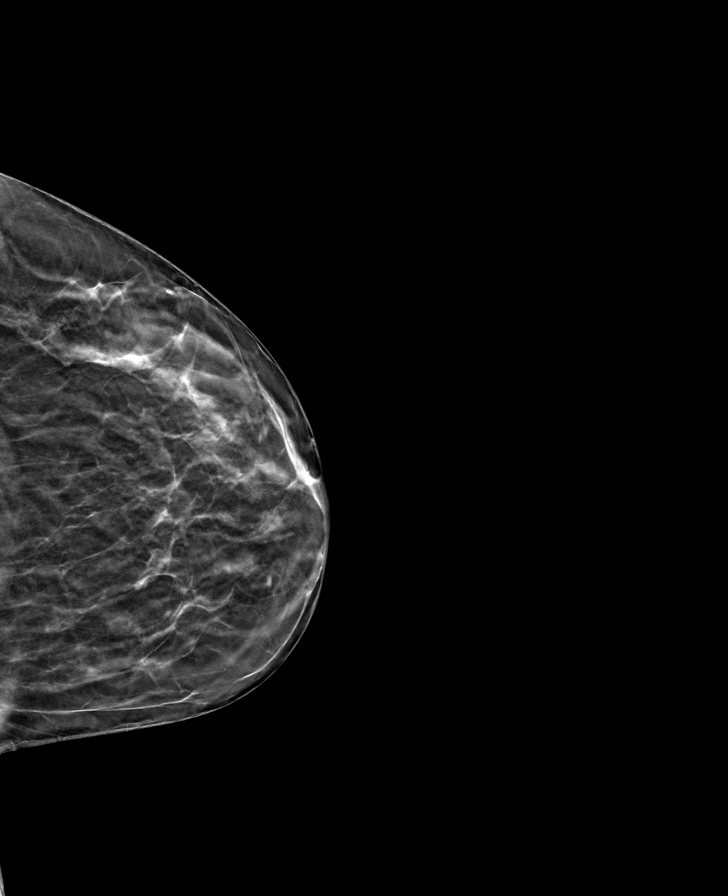

[8 of 24 positions shown; findings below may reference images not displayed]

ACR Breast Density Category b: There are scattered areas of
fibroglandular density.
FINDINGS: There are no findings suspicious for malignancy.
IMPRESSION: No mammographic evidence of malignancy. A result letter of this
screening mammogram will be mailed directly to the patient.

RECOMMENDATION:
Screening mammogram in one year. (Code:51-O-LD2)

BI-RADS CATEGORY  1: Negative.
